# Patient Record
Sex: Male | Born: 1960 | Race: White | Hispanic: No | Marital: Married | Smoking: Never smoker
Health system: Southern US, Community
[De-identification: ages and names within clinical notes are randomized; demographics above are authoritative.]

## PROBLEM LIST (undated history)

## (undated) DIAGNOSIS — I10 Essential (primary) hypertension: Secondary | ICD-10-CM

## (undated) DIAGNOSIS — K219 Gastro-esophageal reflux disease without esophagitis: Secondary | ICD-10-CM

## (undated) DIAGNOSIS — B019 Varicella without complication: Secondary | ICD-10-CM

## (undated) DIAGNOSIS — R001 Bradycardia, unspecified: Secondary | ICD-10-CM

## (undated) DIAGNOSIS — I2510 Atherosclerotic heart disease of native coronary artery without angina pectoris: Secondary | ICD-10-CM

## (undated) DIAGNOSIS — I712 Thoracic aortic aneurysm, without rupture, unspecified: Secondary | ICD-10-CM

## (undated) DIAGNOSIS — E785 Hyperlipidemia, unspecified: Secondary | ICD-10-CM

## (undated) DIAGNOSIS — Z9289 Personal history of other medical treatment: Secondary | ICD-10-CM

## (undated) HISTORY — DX: Personal history of other medical treatment: Z92.89

## (undated) HISTORY — DX: Thoracic aortic aneurysm, without rupture, unspecified: I71.20

## (undated) HISTORY — DX: Gastro-esophageal reflux disease without esophagitis: K21.9

## (undated) HISTORY — DX: Varicella without complication: B01.9

---

## 1990-04-23 HISTORY — PX: APPENDECTOMY: SHX54

## 1997-04-23 HISTORY — PX: HERNIA REPAIR: SHX51

## 2001-09-08 ENCOUNTER — Ambulatory Visit (HOSPITAL_COMMUNITY): Admission: RE | Admit: 2001-09-08 | Discharge: 2001-09-08 | Payer: Self-pay | Admitting: Gastroenterology

## 2005-11-14 ENCOUNTER — Emergency Department (HOSPITAL_COMMUNITY): Admission: EM | Admit: 2005-11-14 | Discharge: 2005-11-14 | Payer: Self-pay | Admitting: Emergency Medicine

## 2010-05-05 ENCOUNTER — Encounter
Admission: RE | Admit: 2010-05-05 | Discharge: 2010-05-05 | Payer: Self-pay | Source: Home / Self Care | Attending: Orthopedic Surgery | Admitting: Orthopedic Surgery

## 2011-03-28 ENCOUNTER — Other Ambulatory Visit: Payer: Self-pay | Admitting: *Deleted

## 2011-03-28 ENCOUNTER — Ambulatory Visit
Admission: RE | Admit: 2011-03-28 | Discharge: 2011-03-28 | Disposition: A | Discharge status: Home / Self Care | Payer: BC Managed Care – PPO | Source: Ambulatory Visit | Attending: *Deleted | Admitting: *Deleted

## 2011-03-28 DIAGNOSIS — M25559 Pain in unspecified hip: Secondary | ICD-10-CM

## 2012-05-24 DIAGNOSIS — I2510 Atherosclerotic heart disease of native coronary artery without angina pectoris: Secondary | ICD-10-CM

## 2012-05-24 HISTORY — DX: Atherosclerotic heart disease of native coronary artery without angina pectoris: I25.10

## 2012-05-24 HISTORY — PX: CORONARY ANGIOPLASTY WITH STENT PLACEMENT: SHX49

## 2012-06-16 ENCOUNTER — Encounter (HOSPITAL_COMMUNITY): Payer: Self-pay | Admitting: *Deleted

## 2012-06-16 ENCOUNTER — Inpatient Hospital Stay (HOSPITAL_COMMUNITY)
Admission: EM | Admit: 2012-06-16 | Discharge: 2012-06-18 | DRG: 853 | Disposition: A | Discharge status: Home / Self Care | Payer: BC Managed Care – PPO | Attending: Cardiovascular Disease | Admitting: Cardiovascular Disease

## 2012-06-16 ENCOUNTER — Emergency Department (HOSPITAL_COMMUNITY): Payer: BC Managed Care – PPO

## 2012-06-16 DIAGNOSIS — I2510 Atherosclerotic heart disease of native coronary artery without angina pectoris: Secondary | ICD-10-CM

## 2012-06-16 DIAGNOSIS — I10 Essential (primary) hypertension: Secondary | ICD-10-CM | POA: Diagnosis present

## 2012-06-16 DIAGNOSIS — R079 Chest pain, unspecified: Secondary | ICD-10-CM

## 2012-06-16 DIAGNOSIS — R778 Other specified abnormalities of plasma proteins: Secondary | ICD-10-CM

## 2012-06-16 DIAGNOSIS — K219 Gastro-esophageal reflux disease without esophagitis: Secondary | ICD-10-CM | POA: Diagnosis present

## 2012-06-16 DIAGNOSIS — R001 Bradycardia, unspecified: Secondary | ICD-10-CM

## 2012-06-16 DIAGNOSIS — E785 Hyperlipidemia, unspecified: Secondary | ICD-10-CM | POA: Diagnosis present

## 2012-06-16 DIAGNOSIS — I249 Acute ischemic heart disease, unspecified: Secondary | ICD-10-CM

## 2012-06-16 DIAGNOSIS — Z7982 Long term (current) use of aspirin: Secondary | ICD-10-CM

## 2012-06-16 DIAGNOSIS — Z79899 Other long term (current) drug therapy: Secondary | ICD-10-CM

## 2012-06-16 DIAGNOSIS — I200 Unstable angina: Secondary | ICD-10-CM

## 2012-06-16 DIAGNOSIS — I214 Non-ST elevation (NSTEMI) myocardial infarction: Principal | ICD-10-CM | POA: Diagnosis present

## 2012-06-16 HISTORY — DX: Bradycardia, unspecified: R00.1

## 2012-06-16 HISTORY — DX: Essential (primary) hypertension: I10

## 2012-06-16 HISTORY — DX: Atherosclerotic heart disease of native coronary artery without angina pectoris: I25.10

## 2012-06-16 HISTORY — DX: Hyperlipidemia, unspecified: E78.5

## 2012-06-16 LAB — PROTIME-INR: Prothrombin Time: 13 seconds (ref 11.6–15.2)

## 2012-06-16 LAB — POCT I-STAT TROPONIN I

## 2012-06-16 LAB — POCT I-STAT, CHEM 8
Chloride: 104 mEq/L (ref 96–112)
Creatinine, Ser: 1.3 mg/dL (ref 0.50–1.35)
Glucose, Bld: 91 mg/dL (ref 70–99)

## 2012-06-16 LAB — CBC
HCT: 38.8 % — ABNORMAL LOW (ref 39.0–52.0)
Hemoglobin: 14 g/dL (ref 13.0–17.0)
MCH: 35.4 pg — ABNORMAL HIGH (ref 26.0–34.0)
MCHC: 36.1 g/dL — ABNORMAL HIGH (ref 30.0–36.0)
Platelets: 247 10*3/uL (ref 150–400)
RBC: 3.96 MIL/uL — ABNORMAL LOW (ref 4.22–5.81)

## 2012-06-16 MED ORDER — NITROGLYCERIN 2 % TD OINT
1.0000 [in_us] | TOPICAL_OINTMENT | Freq: Once | TRANSDERMAL | Status: AC
  Administered 2012-06-16: 1 [in_us] via TOPICAL
  Filled 2012-06-16: qty 1

## 2012-06-16 MED ORDER — ATORVASTATIN CALCIUM 80 MG PO TABS: 80.0000 mg | ORAL_TABLET | Freq: Every day | ORAL | Status: DC

## 2012-06-16 MED ORDER — MECLIZINE HCL 25 MG PO TABS
25.0000 mg | ORAL_TABLET | Freq: Once | ORAL | Status: DC
  Filled 2012-06-16: qty 1

## 2012-06-16 MED ORDER — MORPHINE SULFATE 4 MG/ML IJ SOLN
4.0000 mg | Freq: Once | INTRAMUSCULAR | Status: AC
  Administered 2012-06-16: 4 mg via INTRAVENOUS
  Filled 2012-06-16: qty 1

## 2012-06-16 MED ORDER — HEPARIN BOLUS VIA INFUSION
4000.0000 [IU] | Freq: Once | INTRAVENOUS | Status: AC
  Administered 2012-06-16: 4000 [IU] via INTRAVENOUS

## 2012-06-16 MED ORDER — BUPROPION HCL ER (XL) 150 MG PO TB24
150.0000 mg | ORAL_TABLET | Freq: Every day | ORAL | Status: DC
  Administered 2012-06-17 – 2012-06-18 (×2): 150 mg via ORAL
  Filled 2012-06-16 (×2): qty 1

## 2012-06-16 MED ORDER — ONDANSETRON HCL 4 MG/2ML IJ SOLN: 4.0000 mg | Freq: Four times a day (QID) | INTRAMUSCULAR | Status: DC | PRN

## 2012-06-16 MED ORDER — ASPIRIN EC 81 MG PO TBEC
81.0000 mg | DELAYED_RELEASE_TABLET | Freq: Every day | ORAL | Status: DC
  Administered 2012-06-17 – 2012-06-18 (×2): 81 mg via ORAL
  Filled 2012-06-16 (×2): qty 1

## 2012-06-16 MED ORDER — ACETAMINOPHEN 325 MG PO TABS: 650.0000 mg | ORAL_TABLET | ORAL | Status: DC | PRN

## 2012-06-16 MED ORDER — BUPROPION HCL ER (XL) 150 MG PO TB24: 150.0000 mg | ORAL_TABLET | Freq: Every day | ORAL | Status: DC

## 2012-06-16 MED ORDER — HEPARIN (PORCINE) IN NACL 100-0.45 UNIT/ML-% IJ SOLN
1300.0000 [IU]/h | INTRAMUSCULAR | Status: DC
  Administered 2012-06-16: 1300 [IU]/h via INTRAVENOUS
  Filled 2012-06-16 (×2): qty 250

## 2012-06-16 MED ORDER — ASPIRIN EC 325 MG PO TBEC: 325.0000 mg | DELAYED_RELEASE_TABLET | Freq: Every day | ORAL | Status: DC

## 2012-06-16 MED ORDER — SODIUM CHLORIDE 0.9 % IV SOLN
1.0000 mL/kg/h | INTRAVENOUS | Status: DC
  Administered 2012-06-17: 1 mL/kg/h via INTRAVENOUS

## 2012-06-16 MED ORDER — NITROGLYCERIN 2 % TD OINT
1.0000 [in_us] | TOPICAL_OINTMENT | Freq: Four times a day (QID) | TRANSDERMAL | Status: DC
Start: 2012-06-16 — End: 2012-06-17
  Administered 2012-06-16 – 2012-06-17 (×2): 1 [in_us] via TOPICAL
  Filled 2012-06-16: qty 30

## 2012-06-16 MED ORDER — SODIUM CHLORIDE 0.9 % IJ SOLN: 3.0000 mL | INTRAMUSCULAR | Status: DC | PRN

## 2012-06-16 MED ORDER — METOPROLOL TARTRATE 25 MG PO TABS
25.0000 mg | ORAL_TABLET | Freq: Two times a day (BID) | ORAL | Status: DC
  Filled 2012-06-16: qty 1

## 2012-06-16 MED ORDER — CAPTOPRIL 6.25 MG HALF TABLET
6.2500 mg | ORAL_TABLET | Freq: Three times a day (TID) | ORAL | Status: DC
  Administered 2012-06-17 (×3): 6.25 mg via ORAL
  Filled 2012-06-16 (×8): qty 1

## 2012-06-16 MED ORDER — SODIUM CHLORIDE 0.9 % IV SOLN: 250.0000 mL | INTRAVENOUS | Status: DC | PRN

## 2012-06-16 MED ORDER — ATORVASTATIN CALCIUM 40 MG PO TABS
40.0000 mg | ORAL_TABLET | Freq: Every day | ORAL | Status: AC
  Administered 2012-06-16: 40 mg via ORAL
  Filled 2012-06-16: qty 1

## 2012-06-16 MED ORDER — METOPROLOL TARTRATE 12.5 MG HALF TABLET: 12.5000 mg | ORAL_TABLET | Freq: Two times a day (BID) | ORAL | Status: DC

## 2012-06-16 MED ORDER — ASPIRIN 81 MG PO CHEW
324.0000 mg | CHEWABLE_TABLET | Freq: Once | ORAL | Status: AC
  Administered 2012-06-16: 324 mg via ORAL
  Filled 2012-06-16: qty 4

## 2012-06-16 MED ORDER — DIAZEPAM 2 MG PO TABS
2.0000 mg | ORAL_TABLET | ORAL | Status: AC
  Administered 2012-06-17: 2 mg via ORAL
  Filled 2012-06-16: qty 1

## 2012-06-16 MED ORDER — ATORVASTATIN CALCIUM 80 MG PO TABS
80.0000 mg | ORAL_TABLET | Freq: Every day | ORAL | Status: DC
Start: 2012-06-16 — End: 2012-06-16
  Filled 2012-06-16: qty 1

## 2012-06-16 MED ORDER — SODIUM CHLORIDE 0.9 % IJ SOLN: 3.0000 mL | Freq: Two times a day (BID) | INTRAMUSCULAR | Status: DC

## 2012-06-16 MED ORDER — NITROGLYCERIN 0.4 MG SL SUBL: 0.4000 mg | SUBLINGUAL_TABLET | SUBLINGUAL | Status: DC | PRN

## 2012-06-16 MED ORDER — ONDANSETRON HCL 4 MG/2ML IJ SOLN
4.0000 mg | Freq: Once | INTRAMUSCULAR | Status: AC
  Administered 2012-06-16: 4 mg via INTRAVENOUS
  Filled 2012-06-16: qty 2

## 2012-06-16 MED ORDER — ASPIRIN EC 81 MG PO TBEC: 81.0000 mg | DELAYED_RELEASE_TABLET | Freq: Every day | ORAL | Status: DC

## 2012-06-16 NOTE — ED Notes (Addendum)
Pt with chest pain since this am.  He originally thought it was heart burn, but pain increased.  Was seen by pcp and sent here for abnormal ekg.  Only med to relieve pain was gavascon.

## 2012-06-16 NOTE — H&P (Addendum)
Patient ID: Joshua King MRN: [id number], DOB/AGE: 54-26-1962   Admit date: 06/16/2012 Date of Consult: @TODAY @  Primary Physician: Default, Provider, MD Primary Cardiologist: New    Problem List: Past Medical History  Diagnosis Date  . Heartburn     Past Surgical History  Procedure Laterality Date  . Appendectomy  1992  . Hernia repair  1999    inguinal     Allergies: No Known Allergies  HPI: Patient is a 52 yo who presents to the [facility] ER for evaluation of CP The patient has no prior cardiac history. He has had 4 episodes that he considered GE reflux in past, last 15 yrs ago. Last week on Mon through Wed he had intermitt episodes of chestr burning.  No associated SOB  Seemed to be more when active.  Relieved with rest  Thought might be reflux as similar to what had in past. Spoke to [facility] Medoff.  Rx for Omeprazole made.  Patient also began taking Gaviscon. Thurs, Fri no complaints Saturday went walking at [employer] school track.  Developed chest burning.  Took ASA  Rest  Went away. Sunday did work out (wts) then walked  Some burning  Eased with rest.  Swam 4 lengths pool  Developed substernal burning  Stopped.   Last night slept OK Today woke up.  Developed intermitt chest burning.Mild.  Waves.  Considered not going on business trip. Got to airport.  Pain increased to 8/10.  Cancelled trip Went to [facility] Medoff office  EKG done  Told to come to ER.  Currently pain free.  No SOB  No    Inpatient Medications:  Wellbutrin  Family History:  Father 68  CAD MI at 25.  Dementia Mother HTN 2 brothers 1 sister - A and W   History   Social History  . Marital Status: Married         Number of Children: 2  .     Occupational History  . Not on file.   Social History Main Topics  . Smoking status: No  .    . Alcohol Use: 6-8 glasses wine per week  . Drug Use: No  .      Concern              Review of Systems: Patient noted some cough and runny nose today. No F/C.    Otherwise all other systems reviewed and are otherwise negative except as noted above.  Physical Exam: Filed Vitals:   06/16/12 2030  BP: 138/110  Pulse: 70  Temp:   Resp: 19    BP currently 135/77  General: Well developed, well nourished, in no acute distress. Head: Normocephalic, atraumatic, sclera non-icteric Neck: Negative for carotid bruits. JVP not elevated. Lungs: Clear bilaterally to auscultation without wheezes, rales, or rhonchi. Breathing is unlabored. Heart: RRR with S1 S2. No murmurs, rubs, or gallops appreciated. Abdomen: Soft, non-tender, non-distended with normoactive bowel sounds. No hepatomegaly. No rebound/guarding. No obvious abdominal masses. Msk:  Strength and tone appears normal for age. Extremities: No clubbing, cyanosis or edema.  Distal pedal pulses are 2+ and equal bilaterally. Neuro: Alert and oriented X 3. Moves all extremities spontaneously. Psych:  Responds to questions appropriately with a normal affect.  Labs: Results for orders placed during the hospital encounter of 06/16/12 (from the past 24 hour(s))  CBC     Status: Abnormal   Collection Time    06/16/12  7:14 PM      Result Value Range  WBC 6.3  4.0 - 10.5 K/uL   RBC 3.96 (*) 4.22 - 5.81 MIL/uL   Hemoglobin 14.0  13.0 - 17.0 g/dL   HCT 11.9 (*) 14.7 - 82.9 %   MCV 98.0  78.0 - 100.0 fL   MCH 35.4 (*) 26.0 - 34.0 pg   MCHC 36.1 (*) 30.0 - 36.0 g/dL   RDW 56.2  13.0 - 86.5 %   Platelets 247  150 - 400 K/uL  POCT I-STAT TROPONIN I     Status: Abnormal   Collection Time    06/16/12  7:38 PM      Result Value Range   Troponin i, poc 0.28 (*) 0.00 - 0.08 ng/mL   Comment NOTIFIED PHYSICIAN     Comment 3           POCT I-STAT, CHEM 8     Status: None   Collection Time    06/16/12  7:40 PM      Result Value Range   Sodium 141  135 - 145 mEq/L   Potassium 4.0  3.5 - 5.1 mEq/L   Chloride 104  96 - 112 mEq/L   BUN 11  6 - 23 mg/dL   Creatinine, Ser 7.84  0.50 - 1.35 mg/dL   Glucose,  Bld 91  70 - 99 mg/dL   Calcium, Ion 6.96  2.95 - 1.23 mmol/L   TCO2 29  0 - 100 mmol/L   Hemoglobin 14.6  13.0 - 17.0 g/dL   HCT 28.4  13.2 - 44.0 %  PROTIME-INR     Status: None   Collection Time    06/16/12  8:11 PM      Result Value Range   Prothrombin Time 13.0  11.6 - 15.2 seconds   INR 0.99  0.00 - 1.49  APTT     Status: None   Collection Time    06/16/12  8:11 PM      Result Value Range   aPTT 30  24 - 37 seconds    Radiology/Studies: Dg Chest Port 1 View  06/16/2012  *RADIOLOGY REPORT*  Clinical Data: Chest pain  PORTABLE CHEST - 1 VIEW  Comparison: 11/14/2005 chest radiograph  Findings: The cardiomediastinal silhouette is unremarkable. The lungs are clear. There is no evidence of focal airspace disease, pulmonary edema, suspicious pulmonary nodule/mass, pleural effusion, or pneumothorax. No acute bony abnormalities are identified.  IMPRESSION: No evidence of active cardiopulmonary disease.   Original Report Authenticated By: Harmon Pier, M.D.     EKG:  SR 73  T wave inversion I, AVL.  ASSESSMENT AND PLAN:  Patient is a 52 yo who presents with a 1 wk history of substernal chest burning.  Intermittent  Mostly exertional.  Today woke up and developed pain today that worsened.  He had attrib to reflux and today went to his gastroenterologist.  EKG with changes  Sent to ER Currently pain free.  EKG with T wave inversion in I and AVL  Labs signif for trop I of 0.28.  Plan:  Admit to telemetry  Begin IV heparin.  ASA. Continue NTG paste.  WIll not start lopressor as resting HR in 50s.  Empiric lipitor. Cycle cardiac enzymes.  Plan L heart cath for am  Discussed with patient  Will give Protonix empirically.  Will check lipid panel in AM  Patient says HDL was high but LDL was a little high as well (december 2013)    Signed, Dietrich Pates 06/16/2012, 9:16 PM

## 2012-06-16 NOTE — ED Notes (Signed)
Notified triage nurse Christa and Dr. Denton Lank, patient moved to room 3

## 2012-06-16 NOTE — ED Notes (Signed)
Dr. Kinnie Scales called to report pt. Coming with chest pain, EkG with pt.

## 2012-06-16 NOTE — ED Notes (Signed)
EDP at bedside for assessment 

## 2012-06-16 NOTE — ED Provider Notes (Signed)
History     CSN: [id number]  Arrival date & time 06/16/12  1843   First MD Initiated Contact with Patient 06/16/12 2001      Chief Complaint  Patient presents with  . Chest Pain    (Consider location/radiation/quality/duration/timing/severity/associated sxs/prior treatment) Patient is a 52 y.o. male presenting with chest pain. The history is provided by the patient.  Chest Pain Associated symptoms: no abdominal pain, no back pain, no cough, no fever, no headache, no palpitations and no shortness of breath   pt with hx borderline hypercholesterolemia, borderline htn, c/o intermittent mid chest pain for past week. States ?hx gerd which he initially was attributing his symptoms to, but states episodes cp worse through week and especially today. Worse w exertion, ?better w rest. Dull, burning discomfort, non radiating. No associated nv, diaphoresis or sob. Pain is not pleuritic. No leg pain or swelling. Denies hx cad or family hx premature cad, ?fam hx cad in older age. Non smoker. No hx dvt or pe, does travel, no surgery or trauma. Denies chest wall injury or strain. Reports neg stress test several yrs ago.     Past Medical History  Diagnosis Date  . Heartburn     Past Surgical History  Procedure Laterality Date  . Appendectomy  1992  . Hernia repair  1999    inguinal    No family history on file.  History  Substance Use Topics  . Smoking status: Not on file  . Smokeless tobacco: Not on file  . Alcohol Use: Not on file      Review of Systems  Constitutional: Negative for fever.  HENT: Negative for neck pain.   Eyes: Negative for visual disturbance.  Respiratory: Negative for cough and shortness of breath.   Cardiovascular: Positive for chest pain. Negative for palpitations and leg swelling.  Gastrointestinal: Negative for abdominal pain.  Genitourinary: Negative for flank pain.  Musculoskeletal: Negative for back pain.  Skin: Negative for rash.  Neurological:  Negative for headaches.  Hematological: Does not bruise/bleed easily.  Psychiatric/Behavioral: Negative for confusion.    Allergies  Review of patient's allergies indicates no known allergies.  Home Medications   Current Outpatient Rx  Name  Route  Sig  Dispense  Refill  . aspirin EC 81 MG tablet   Oral   Take 162 mg by mouth once.         Marland Kitchen buPROPion (WELLBUTRIN XL) 150 MG 24 hr tablet   Oral   Take 150 mg by mouth daily.         . diazepam (VALIUM) 2 MG tablet   Oral   Take 0.5 mg by mouth once.         . meclizine (ANTIVERT) 25 MG tablet   Oral   Take 25 mg by mouth once.           BP 148/85  Pulse 70  Temp(Src) 99 F (37.2 C) (Oral)  Resp 18  Ht 5\' 10"  (1.778 m)  Wt 230 lb (104.327 kg)  BMI 33 kg/m2  SpO2 100%  Physical Exam  Nursing note and vitals reviewed. Constitutional: He is oriented to person, place, and time. He appears well-developed and well-nourished. No distress.  HENT:  Head: Atraumatic.  Eyes: Conjunctivae are normal. No scleral icterus.  Neck: Neck supple. No tracheal deviation present.  Cardiovascular: Normal rate, regular rhythm, normal heart sounds and intact distal pulses.  Exam reveals no gallop and no friction rub.   No murmur heard. Pulmonary/Chest: Effort  normal. No accessory muscle usage. No respiratory distress. He exhibits no tenderness.  Abdominal: Soft. He exhibits no distension. There is no tenderness.  Musculoskeletal: Normal range of motion. He exhibits no edema and no tenderness.  Neurological: He is alert and oriented to person, place, and time.  Skin: Skin is warm and dry.  Psychiatric: He has a normal mood and affect.    ED Course  Procedures (including critical care time)  Results for orders placed during the hospital encounter of 06/16/12  CBC      Result Value Range   WBC 6.3  4.0 - 10.5 K/uL   RBC 3.96 (*) 4.22 - 5.81 MIL/uL   Hemoglobin 14.0  13.0 - 17.0 g/dL   HCT 16.1 (*) 09.6 - 04.5 %   MCV 98.0   78.0 - 100.0 fL   MCH 35.4 (*) 26.0 - 34.0 pg   MCHC 36.1 (*) 30.0 - 36.0 g/dL   RDW 40.9  81.1 - 91.4 %   Platelets 247  150 - 400 K/uL  POCT I-STAT, CHEM 8      Result Value Range   Sodium 141  135 - 145 mEq/L   Potassium 4.0  3.5 - 5.1 mEq/L   Chloride 104  96 - 112 mEq/L   BUN 11  6 - 23 mg/dL   Creatinine, Ser 7.82  0.50 - 1.35 mg/dL   Glucose, Bld 91  70 - 99 mg/dL   Calcium, Ion 9.56  2.13 - 1.23 mmol/L   TCO2 29  0 - 100 mmol/L   Hemoglobin 14.6  13.0 - 17.0 g/dL   HCT 08.6  57.8 - 46.9 %  POCT I-STAT TROPONIN I      Result Value Range   Troponin i, poc 0.28 (*) 0.00 - 0.08 ng/mL   Comment NOTIFIED PHYSICIAN     Comment 3            Dg Chest Port 1 View  06/16/2012  *RADIOLOGY REPORT*  Clinical Data: Chest pain  PORTABLE CHEST - 1 VIEW  Comparison: 11/14/2005 chest radiograph  Findings: The cardiomediastinal silhouette is unremarkable. The lungs are clear. There is no evidence of focal airspace disease, pulmonary edema, suspicious pulmonary nodule/mass, pleural effusion, or pneumothorax. No acute bony abnormalities are identified.  IMPRESSION: No evidence of active cardiopulmonary disease.   Original Report Authenticated By: Harmon Pier, M.D.       1. Unstable angina   2. Elevated troponin       MDM  Iv ns. O2. Monitor. Ecg. Cxr. Labs.  Pt w baby asa earlier today, chewable asa given in ed.  Sl chest discomfort still present, nt paste, morphine iv.   Heparin bolus/gtt per pharmacy.   From initial labs, troponin elevated, cp initially persisting on ed arrival.  Cardiology called-  Dr Tenny Craw and fellow will see in ed/admit.  Reviewed nursing notes and prior charts for additional history.    Date: 06/16/2012  Rate: 73  Rhythm: normal sinus rhythm  QRS Axis: normal  Intervals: normal  ST/T Wave abnormalities: nonspecific T wave changes  Conduction Disutrbances:none  Narrative Interpretation:   Old EKG Reviewed: none available  Recheck no chest pain.  Pt  awaiting admission.  CRITICAL CARE Performed by: Suzi Roots   Total critical care time: 35  Critical care time was exclusive of separately billable procedures and treating other patients.  Critical care was necessary to treat or prevent imminent or life-threatening deterioration.  Critical care was time spent personally by me  on the following activities: development of treatment plan with patient and/or surrogate as well as nursing, discussions with consultants, evaluation of patient's response to treatment, examination of patient, obtaining history from patient or surrogate, ordering and performing treatments and interventions, ordering and review of laboratory studies, ordering and review of radiographic studies, pulse oximetry and re-evaluation of patient's condition.       Suzi Roots, MD 06/16/12 2035

## 2012-06-16 NOTE — Progress Notes (Signed)
ANTICOAGULATION CONSULT NOTE - Initial Consult  Pharmacy Consult for UFH Indication: NSTEMI  No Known Allergies  Patient Measurements: Height: 5\' 10"  (177.8 cm) Weight: 230 lb (104.327 kg) IBW/kg (Calculated) : 73 Heparin Dosing Weight: 95kg  Vital Signs: Temp: 99 F (37.2 C) (02/24 1858) Temp src: Oral (02/24 1858) BP: 148/85 mmHg (02/24 1858) Pulse Rate: 70 (02/24 1858)  Labs:  Recent Labs  06/16/12 1914 06/16/12 1940  HGB 14.0 14.6  HCT 38.8* 43.0  PLT 247  --   CREATININE  --  1.30    Estimated Creatinine Clearance: 79.5 ml/min (by C-G formula based on Cr of 1.3).   Medical History: Past Medical History  Diagnosis Date  . Heartburn     Medications:   (Not in a hospital admission)  Assessment: 52 y/o male patient admitted with chest pain, abnormal EKG and positive cardiac enzymes requiring anticoagulation for NSTEMI.  Goal of Therapy:  Heparin level 0.3-0.7 units/ml Monitor platelets by anticoagulation protocol: Yes   Plan:  Heparin 4000 unit IV bolus followed by infusion at 1300 units/hr . Check 6 hour heparin level with daily cbc and heparin level.  Verlene Mayer, PharmD, BCPS Pager [phone] 06/16/2012,8:28 PM

## 2012-06-16 NOTE — H&P (Signed)
Joshua King is an 52 y.o. male.   Chief Complaint: Chest Pain HPI: 52 yo man with PMH of GERD, hypertension and dyslipidemia who has had intermittent chest pain associated with exertion for several days. He thought his pain was related to his underlying GERD but symptoms have continued to persist, worse with exertion and not improving leading to presentation. He characterizes the pain as dull with some burning, no radiation, no associated nausea/vomiting or shortness of breath. The pain has happened similar to this 4-5x over the last 15 years and started again most recently approximately 1 week ago last Monday. Symptoms now have occurred while walking on the track over the weekend (1/2 mile into a 3-4 mile walk) and again today after a walk. He frequently walks 3-4 miles, lifts weights and stays very active - no DOE/SOB.  Multiple recent travels to [place] and back. [place] contacts. He has had a negative stress test in the past. He took aspirin at home and now up to 324 mg dose + heparin gtt in ER and currently 1300 units/hr.    Past Medical History  Diagnosis Date  . Heartburn     Past Surgical History  Procedure Laterality Date  . Appendectomy  1992  . Hernia repair  1999    inguinal   Family history: mother living in 57s overweight; father with CAD/atrial fibrillation starting in 17s, living well in his 85s now Family History  Problem Relation Age of Onset  . CAD Father 44  . Heart attack Father    Social History:  reports that he has never smoked. He has never used smokeless tobacco. He reports that he drinks about 3.0 ounces of alcohol per week. His drug history is not on file.  Allergies: No Known Allergies  Medications Prior to Admission  Medication Sig Dispense Refill  . aspirin EC 81 MG tablet Take 162 mg by mouth once.      Marland Kitchen buPROPion (WELLBUTRIN XL) 150 MG 24 hr tablet Take 150 mg by mouth daily.      . diazepam (VALIUM) 2 MG tablet Take 0.5 mg by mouth once.      .  meclizine (ANTIVERT) 25 MG tablet Take 25 mg by mouth once.        Results for orders placed during the hospital encounter of 06/16/12 (from the past 48 hour(s))  CBC     Status: Abnormal   Collection Time    06/16/12  7:14 PM      Result Value Range   WBC 6.3  4.0 - 10.5 K/uL   RBC 3.96 (*) 4.22 - 5.81 MIL/uL   Hemoglobin 14.0  13.0 - 17.0 g/dL   HCT 45.4 (*) 09.8 - 11.9 %   MCV 98.0  78.0 - 100.0 fL   MCH 35.4 (*) 26.0 - 34.0 pg   MCHC 36.1 (*) 30.0 - 36.0 g/dL   RDW 14.7  82.9 - 56.2 %   Platelets 247  150 - 400 K/uL  POCT I-STAT TROPONIN I     Status: Abnormal   Collection Time    06/16/12  7:38 PM      Result Value Range   Troponin i, poc 0.28 (*) 0.00 - 0.08 ng/mL   Comment NOTIFIED PHYSICIAN     Comment 3            Comment: Due to the release kinetics of cTnI,     a negative result within the first hours     of the  onset of symptoms does not rule out     myocardial infarction with certainty.     If myocardial infarction is still suspected,     repeat the test at appropriate intervals.  POCT I-STAT, CHEM 8     Status: None   Collection Time    06/16/12  7:40 PM      Result Value Range   Sodium 141  135 - 145 mEq/L   Potassium 4.0  3.5 - 5.1 mEq/L   Chloride 104  96 - 112 mEq/L   BUN 11  6 - 23 mg/dL   Creatinine, Ser 4.09  0.50 - 1.35 mg/dL   Glucose, Bld 91  70 - 99 mg/dL   Calcium, Ion 8.11  9.14 - 1.23 mmol/L   TCO2 29  0 - 100 mmol/L   Hemoglobin 14.6  13.0 - 17.0 g/dL   HCT 78.2  95.6 - 21.3 %  PROTIME-INR     Status: None   Collection Time    06/16/12  8:11 PM      Result Value Range   Prothrombin Time 13.0  11.6 - 15.2 seconds   INR 0.99  0.00 - 1.49  APTT     Status: None   Collection Time    06/16/12  8:11 PM      Result Value Range   aPTT 30  24 - 37 seconds  TROPONIN I     Status: Abnormal   Collection Time    06/16/12  9:03 PM      Result Value Range   Troponin I 0.72 (*) <0.30 ng/mL   Comment:            Due to the release kinetics of  cTnI,     a negative result within the first hours     of the onset of symptoms does not rule out     myocardial infarction with certainty.     If myocardial infarction is still suspected,     repeat the test at appropriate intervals.     CRITICAL RESULT CALLED TO, READ BACK BY AND VERIFIED WITH:     E GENTILE RN 2.24.14 AT 2208 BY ROMEROJ   Dg Chest [facility] 1 View  06/16/2012  *RADIOLOGY REPORT*  Clinical Data: Chest pain  PORTABLE CHEST - 1 VIEW  Comparison: 11/14/2005 chest radiograph  Findings: The cardiomediastinal silhouette is unremarkable. The lungs are clear. There is no evidence of focal airspace disease, pulmonary edema, suspicious pulmonary nodule/mass, pleural effusion, or pneumothorax. No acute bony abnormalities are identified.  IMPRESSION: No evidence of active cardiopulmonary disease.   Original Report Authenticated By: Harmon Pier, M.D.     Review of Systems  Constitutional: Negative for fever, chills and weight loss.  HENT: Negative for hearing loss, neck pain and tinnitus.   Eyes: Negative for blurred vision, double vision and photophobia.  Respiratory: Negative for cough, hemoptysis and sputum production.   Cardiovascular: Positive for chest pain and palpitations. Negative for orthopnea, claudication and leg swelling.  Gastrointestinal: Negative for heartburn, nausea, vomiting and abdominal pain.  Genitourinary: Negative for dysuria, urgency and frequency.  Musculoskeletal: Negative for myalgias and back pain.  Skin: Negative for itching and rash.  Neurological: Negative for dizziness, tingling, sensory change and headaches.  Endo/Heme/Allergies: Negative for environmental allergies. Does not bruise/bleed easily.  Psychiatric/Behavioral: Negative for depression, suicidal ideas and substance abuse.    Blood pressure 116/71, pulse 51, temperature 99 F (37.2 C), temperature source Oral, resp. rate 18, height 5'  10" (1.778 m), weight 104.327 kg (230 lb), SpO2  100.00%. Physical Exam  Nursing note and vitals reviewed. Constitutional: He is oriented to person, place, and time. He appears well-developed and well-nourished. No distress.  HENT:  Head: Normocephalic and atraumatic.  Nose: Nose normal.  Mouth/Throat: Oropharynx is clear and moist. No oropharyngeal exudate.  Eyes: Conjunctivae and EOM are normal. Pupils are equal, round, and reactive to light. No scleral icterus.  Neck: Normal range of motion. Neck supple. No JVD present. No tracheal deviation present. No thyromegaly present.  Cardiovascular: Regular rhythm, normal heart sounds and intact distal pulses.  Exam reveals no gallop and no friction rub.   No murmur heard. Sinus bradycardia  Respiratory: Effort normal and breath sounds normal. No respiratory distress. He has no wheezes. He has no rales.  GI: Soft. Bowel sounds are normal. He exhibits no distension. There is no tenderness. There is no rebound.  Musculoskeletal: Normal range of motion. He exhibits no edema and no tenderness.  Neurological: He is alert and oriented to person, place, and time. He has normal reflexes. No cranial nerve deficit. Coordination normal.  Skin: Skin is warm and dry. No rash noted. He is not diaphoretic. No erythema.  Psychiatric: He has a normal mood and affect. His behavior is normal.     Labs reviewed; Troponin 0.28, wbc 6.3, h/h 14/39, plt 247s, na 141, K 4.0, cr 1.3, bun 11 Problem List Exertional Chest Pain + Troponin/ACS Hypertension GERD Dyslipidemia Cr 1.3  Assessment/Plan 52 yo man with history of GERD, hypertension, dyslipidemia now with exertional chest pain and elevated troponin consistent with ACS.  - continue heparin gtt per pharmacy - received bolus and currently 1300 units/hr - received 324 mg aspirin - given chest pain free, will defer P2Y2 until time of left heart catheterization in AM unless symptoms change overnight; then will consider GP2B3A - TSH, hba1c, lipid panel - Echo  in AM if no LV gram - defer to AM team   Dacen Frayre 06/16/2012, 11:07 PM

## 2012-06-16 NOTE — ED Notes (Signed)
Critical troponin 0.72 from lab, EDP aware.

## 2012-06-16 NOTE — ED Notes (Signed)
Admitting MD at bedside.

## 2012-06-17 ENCOUNTER — Encounter (HOSPITAL_COMMUNITY)
Admission: EM | Disposition: A | Discharge status: Home / Self Care | Payer: Self-pay | Source: Home / Self Care | Attending: Cardiovascular Disease

## 2012-06-17 ENCOUNTER — Other Ambulatory Visit: Payer: Self-pay

## 2012-06-17 ENCOUNTER — Encounter (HOSPITAL_COMMUNITY): Payer: Self-pay | Admitting: *Deleted

## 2012-06-17 DIAGNOSIS — I2510 Atherosclerotic heart disease of native coronary artery without angina pectoris: Secondary | ICD-10-CM

## 2012-06-17 HISTORY — PX: LEFT HEART CATHETERIZATION WITH CORONARY ANGIOGRAM: SHX5451

## 2012-06-17 HISTORY — PX: PERCUTANEOUS CORONARY STENT INTERVENTION (PCI-S): SHX5485

## 2012-06-17 LAB — CBC
Hemoglobin: 12.8 g/dL — ABNORMAL LOW (ref 13.0–17.0)
MCH: 34.5 pg — ABNORMAL HIGH (ref 26.0–34.0)
Platelets: 222 10*3/uL (ref 150–400)
RBC: 3.71 MIL/uL — ABNORMAL LOW (ref 4.22–5.81)
WBC: 6 10*3/uL (ref 4.0–10.5)

## 2012-06-17 LAB — HEPARIN LEVEL (UNFRACTIONATED): Heparin Unfractionated: 0.34 IU/mL (ref 0.30–0.70)

## 2012-06-17 LAB — LIPID PANEL
Cholesterol: 219 mg/dL — ABNORMAL HIGH (ref 0–200)
Total CHOL/HDL Ratio: 3.4 RATIO
Triglycerides: 72 mg/dL (ref <150)
VLDL: 14 mg/dL (ref 0–40)

## 2012-06-17 LAB — COMPREHENSIVE METABOLIC PANEL
ALT: 22 U/L (ref 0–53)
AST: 23 U/L (ref 0–37)
Albumin: 3.6 g/dL (ref 3.5–5.2)
Alkaline Phosphatase: 40 U/L (ref 39–117)
Calcium: 9 mg/dL (ref 8.4–10.5)
Glucose, Bld: 96 mg/dL (ref 70–99)
Potassium: 4.2 mEq/L (ref 3.5–5.1)
Sodium: 139 mEq/L (ref 135–145)
Total Protein: 6.5 g/dL (ref 6.0–8.3)

## 2012-06-17 LAB — TROPONIN I
Troponin I: 1.15 ng/mL (ref <0.30)
Troponin I: 0.77 ng/mL (ref <0.30)

## 2012-06-17 LAB — HEMOGLOBIN A1C: Mean Plasma Glucose: 111 mg/dL (ref <117)

## 2012-06-17 SURGERY — LEFT HEART CATHETERIZATION WITH CORONARY ANGIOGRAM
Anesthesia: LOCAL

## 2012-06-17 MED ORDER — HEPARIN (PORCINE) IN NACL 2-0.9 UNIT/ML-% IJ SOLN
INTRAMUSCULAR | Status: AC
  Filled 2012-06-17: qty 500

## 2012-06-17 MED ORDER — VERAPAMIL HCL 2.5 MG/ML IV SOLN
INTRAVENOUS | Status: AC
  Filled 2012-06-17: qty 2

## 2012-06-17 MED ORDER — SODIUM CHLORIDE 0.9 % IV SOLN
0.2500 mg/kg/h | INTRAVENOUS | Status: AC
  Administered 2012-06-17: 0.25 mg/kg/h via INTRAVENOUS
  Filled 2012-06-17: qty 250

## 2012-06-17 MED ORDER — ALUM & MAG HYDROXIDE-SIMETH 200-200-20 MG/5ML PO SUSP
30.0000 mL | ORAL | Status: DC | PRN
  Administered 2012-06-17: 30 mL via ORAL

## 2012-06-17 MED ORDER — HEPARIN SODIUM (PORCINE) 1000 UNIT/ML IJ SOLN
INTRAMUSCULAR | Status: AC
  Filled 2012-06-17: qty 1

## 2012-06-17 MED ORDER — FAMOTIDINE IN NACL 20-0.9 MG/50ML-% IV SOLN
INTRAVENOUS | Status: AC
  Filled 2012-06-17: qty 50

## 2012-06-17 MED ORDER — MIDAZOLAM HCL 2 MG/2ML IJ SOLN
INTRAMUSCULAR | Status: AC
  Filled 2012-06-17: qty 2

## 2012-06-17 MED ORDER — PRASUGREL HCL 10 MG PO TABS
10.0000 mg | ORAL_TABLET | Freq: Every day | ORAL | Status: DC
  Filled 2012-06-17: qty 1

## 2012-06-17 MED ORDER — FENTANYL CITRATE 0.05 MG/ML IJ SOLN
INTRAMUSCULAR | Status: AC
  Filled 2012-06-17: qty 2

## 2012-06-17 MED ORDER — LIDOCAINE HCL (PF) 1 % IJ SOLN
INTRAMUSCULAR | Status: AC
  Filled 2012-06-17: qty 30

## 2012-06-17 MED ORDER — BIVALIRUDIN 250 MG IV SOLR
INTRAVENOUS | Status: AC
  Filled 2012-06-17: qty 250

## 2012-06-17 MED ORDER — ALUM & MAG HYDROXIDE-SIMETH 200-200-20 MG/5ML PO SUSP
ORAL | Status: AC
  Filled 2012-06-17: qty 30

## 2012-06-17 MED ORDER — ZOLPIDEM TARTRATE 5 MG PO TABS: 5.0000 mg | ORAL_TABLET | Freq: Every evening | ORAL | Status: DC | PRN

## 2012-06-17 MED ORDER — OXYCODONE-ACETAMINOPHEN 5-325 MG PO TABS
1.0000 | ORAL_TABLET | ORAL | Status: DC | PRN
  Administered 2012-06-17: 21:00:00 2 via ORAL
  Administered 2012-06-18: 1 via ORAL
  Filled 2012-06-17: qty 1
  Filled 2012-06-17: qty 2

## 2012-06-17 MED ORDER — MORPHINE SULFATE 2 MG/ML IJ SOLN
2.0000 mg | INTRAMUSCULAR | Status: DC | PRN
  Administered 2012-06-17: 2 mg via INTRAVENOUS

## 2012-06-17 MED ORDER — NITROGLYCERIN 1 MG/10 ML FOR IR/CATH LAB
INTRA_ARTERIAL | Status: AC
  Filled 2012-06-17: qty 10

## 2012-06-17 MED ORDER — PRASUGREL HCL 10 MG PO TABS
ORAL_TABLET | ORAL | Status: AC
  Administered 2012-06-18: 10 mg via ORAL
  Filled 2012-06-17: qty 6

## 2012-06-17 MED ORDER — MORPHINE SULFATE 2 MG/ML IJ SOLN
INTRAMUSCULAR | Status: AC
  Filled 2012-06-17: qty 1

## 2012-06-17 MED ORDER — SODIUM CHLORIDE 0.9 % IV SOLN: INTRAVENOUS | Status: AC

## 2012-06-17 NOTE — Progress Notes (Signed)
UR Completed Silas Muff Graves-Bigelow, RN,BSN [phone]  

## 2012-06-17 NOTE — Progress Notes (Signed)
    SUBJECTIVE: no chest pain this am.   BP 110/67  Pulse 51  Temp(Src) 97.8 F (36.6 C) (Oral)  Resp 18  Ht 5' 9.5" (1.765 m)  Wt 231 lb 7.7 oz (105 kg)  BMI 33.71 kg/m2  SpO2 100% No intake or output data in the 24 hours ending 06/17/12 1200  PHYSICAL EXAM General: Well developed, well nourished, in no acute distress. Alert and oriented x 3.  Psych:  Good affect, responds appropriately Neck: No JVD. No masses noted.  Lungs: Clear bilaterally with no wheezes or rhonci noted.  Heart: RRR with no murmurs noted. Abdomen: Bowel sounds are present. Soft, non-tender.  Extremities: No lower extremity edema.   LABS: Basic Metabolic Panel:  Recent Labs  16/10/96 1940 06/17/12 0520  NA 141 139  K 4.0 4.2  CL 104 104  CO2  --  28  GLUCOSE 91 96  BUN 11 11  CREATININE 1.30 1.29  CALCIUM  --  9.0  MG  --  2.4   CBC:  Recent Labs  06/16/12 1914 06/16/12 1940 06/17/12 0520  WBC 6.3  --  6.0  HGB 14.0 14.6 12.8*  HCT 38.8* 43.0 37.0*  MCV 98.0  --  99.7  PLT 247  --  222   Cardiac Enzymes:  Recent Labs  06/16/12 2103 06/16/12 2306 06/17/12 0520  TROPONINI 0.72* 1.15* 0.77*   Fasting Lipid Panel:  Recent Labs  06/17/12 0520  CHOL 219*  HDL 65  LDLCALC 140*  TRIG 72  CHOLHDL 3.4    Current Meds: . [facility] HOLD] aspirin EC  81 mg Oral Daily  . [facility] HOLD] buPROPion  150 mg Oral Daily  . [facility] HOLD] captopril  6.25 mg Oral TID  . [facility] HOLD] meclizine  25 mg Oral Once  . [facility] HOLD] nitroGLYCERIN  1 inch Topical Q6H  . sodium chloride  3 mL Intravenous Q12H     ASSESSMENT AND PLAN:  1. NSTEMI: Pt admitted with chest pain worrisome for unstable angina. Has ruled in for MI with elevated troponin. Plans for cardiac cath this am with possible PCI.    Regla Fitzgibbon  2/25/201412:00 PM

## 2012-06-17 NOTE — CV Procedure (Signed)
Cardiac Catheterization Operative Report  Joshua King [id number] 2/25/20141:47 PM Default, Provider, MD  Procedure Performed:  1. Left Heart Catheterization 2. Selective Coronary Angiography 3. Left ventricular angiogram 4. PTCA/DES x 1 Diagonal  5. IVUS Diagonal 6. IVUS LAD 7. PTCA/DES x 1 mid LAD  Operator: Verne Carrow, MD  Arterial access site:  Right radial artery.   Indication:  52 yo male with no prior known medical problems admitted with chest pain and found to have elevated troponin c/w NSTEMI.                                    Procedure Details: The risks, benefits, complications, treatment options, and expected outcomes were discussed with the patient. The patient and/or family concurred with the proposed plan, giving informed consent. The patient was brought to the cath lab after IV hydration was begun and oral premedication was given. The patient was further sedated with Versed and Fentanyl. The right wrist was assessed with an Allens test which was positive. The right wrist was prepped and draped in a sterile fashion. 1% lidocaine was used for local anesthesia. Using the modified Seldinger access technique, a 5 French sheath was placed in the right radial artery. 3 mg Verapamil was given through the sheath. 5000 units IV heparin was given. Standard diagnostic catheters were used to perform selective coronary angiography. A pigtail catheter was used to perform a left ventricular angiogram. He was found to have a severe stenosis in the Diagonal branch and a hazy, severe stenosis in the mid LAD.   The sheath was upsized to a 6 [place] system. I then engaged the left main artery with a XB 3.0 guiding catheter. He was given Effient 60 mg po x 1. A bolus of Angiomax was given and a drip was started. When the ACT was greater than 200, I passed a BMW wire down the LAD and into the Diagonal branch. The stenosis was pre-dilated with a 2.0 x 12 mm balloon x 1. I then  deployed a 2.5 x 16 mm Promus Premier DES in the Diagonal branch. This was post-dilated with a 2.75 x 12 mm Balfour balloon. I felt that the stent did come back to the ostium but there was overlap in most angiographic views. I elected to perform an IVUS run in the Diagonal branch to confirm that the stent did cover the Diagonal branch back to the ostium. The IVUS confirmed that the stent was well expanded and indeed covered back to the ostium. The stenosis was taken from 99% down to 0%. I then turned my attention to the LAD. The same BMW wire was pulled back and then advanced into the distal LAD. IVUS imaging of the mid LAD confirmed the stenosis in the mid LAD was severe. I pre-dilated two lesions in the mid LAD with the 2.0 x 12 mm balloon x 2. I then deployed a 2.75 x 24 mm Promus Premier DES in the mid LAD, covering both severe lesions. The stent was post-dilated with a 3.0 x 12 mm balloon on the proximal edge. There was an excellent angiographic result. The stenosis was taken from 90% down to 0%.   The sheath was removed from the right radial artery and a Terumo hemostasis band was applied at the arteriotomy site on the right wrist.    There were no immediate complications. The patient was taken to the recovery area in  stable condition.   Hemodynamic Findings: Central aortic pressure: 97/60 Left ventricular pressure: 97/9/14  Angiographic Findings:  Left main: No obstructive disease noted.   Left Anterior Descending Artery: Large caliber vessel that courses to the apex. The mid LAD has two severe stenoses. The first stenosis is 90%. The second stenosis is 80%. These lesions are in the mid segment of the LAD and 5 mm apart. The first diagonal branch is small in caliber and has no obstructive disease noted. The second diagonal branch is moderate in caliber and has a 99% stenosis.   Circumflex Artery: Large caliber vessel with large first obtuse marginal branch, moderate caliber second OM branch. No  obstructive disease is noted.   Right Coronary Artery: Large dominant vessel with no obstructive disease noted.   Left Ventricular Angiogram: LVEF=55-60%  Impression: 1. Severe double vessel CAD with severe stenosis in the Diagonal branch and serial severe stenoses in the mid LAD 2. Unstable angina 3. Preserved LV systolic function 4. Successful PTCA/DES x 1 mid LAD 5. Successful PTCA/DES x 1 Diagonal branch  Recommendations: He will need dual anti-platelet therapy with ASA and Effient for at least one year. Will start statin, beta blocker.        Complications:  None. The patient tolerated the procedure well.

## 2012-06-17 NOTE — Progress Notes (Signed)
TR BAND REMOVAL  LOCATION:    right radial  DEFLATED PER PROTOCOL:    yes  TIME BAND OFF / DRESSING APPLIED:    1730   SITE UPON ARRIVAL:    Level 0  SITE AFTER BAND REMOVAL:    Level 0  REVERSE ALLEN'S TEST:     positive  CIRCULATION SENSATION AND MOVEMENT:    Within Normal Limits   yes  COMMENTS:   Tolerated procedure well 

## 2012-06-17 NOTE — Progress Notes (Signed)
ANTICOAGULATION CONSULT NOTE   Pharmacy Consult for UFH Indication: NSTEMI  No Known Allergies  Patient Measurements: Height: 5' 9.5" (176.5 cm) Weight: 231 lb 7.7 oz (105 kg) IBW/kg (Calculated) : 71.85 Heparin Dosing Weight: 95kg  Vital Signs: Temp: 97.8 F (36.6 C) (02/25 0544) Temp src: Oral (02/25 0544) BP: 110/67 mmHg (02/25 0544) Pulse Rate: 51 (02/24 2300)  Labs:  Recent Labs  06/16/12 1914 06/16/12 1940 06/16/12 2011 06/16/12 2103 06/16/12 2306 06/17/12 0520  HGB 14.0 14.6  --   --   --  12.8*  HCT 38.8* 43.0  --   --   --  37.0*  PLT 247  --   --   --   --  222  APTT  --   --  30  --   --   --   LABPROT  --   --  13.0  --   --   --   INR  --   --  0.99  --   --   --   HEPARINUNFRC  --   --   --   --   --  0.34  CREATININE  --  1.30  --   --   --   --   TROPONINI  --   --   --  0.72* 1.15*  --     Estimated Creatinine Clearance: 79.1 ml/min (by C-G formula based on Cr of 1.3).  Assessment: 52 y/o male with chest pain for heparin  Goal of Therapy:  Heparin level 0.3-0.7 units/ml Monitor platelets by anticoagulation protocol: Yes   Plan:  Continue Heparin at current rate F/U after cath  Geannie Risen, PharmD, BCPS 06/17/2012,6:26 AM

## 2012-06-17 NOTE — Interval H&P Note (Signed)
History and Physical Interval Note:  06/17/2012 12:01 PM  Joshua King  has presented today for cardiac cath with possible PCI  with the diagnosis of NSTEMI/chest pain.  The various methods of treatment have been discussed with the patient and family. After consideration of risks, benefits and other options for treatment, the patient has consented to  Procedure(s): LEFT HEART CATHETERIZATION WITH CORONARY ANGIOGRAM (N/A) as a surgical intervention .  The patient's history has been reviewed, patient examined, no change in status, stable for surgery.  I have reviewed the patient's chart and labs.  Questions were answered to the patient's satisfaction.     [name]

## 2012-06-18 ENCOUNTER — Encounter (HOSPITAL_COMMUNITY): Payer: Self-pay | Admitting: Physician Assistant

## 2012-06-18 DIAGNOSIS — I214 Non-ST elevation (NSTEMI) myocardial infarction: Secondary | ICD-10-CM

## 2012-06-18 DIAGNOSIS — I2510 Atherosclerotic heart disease of native coronary artery without angina pectoris: Secondary | ICD-10-CM

## 2012-06-18 DIAGNOSIS — R001 Bradycardia, unspecified: Secondary | ICD-10-CM

## 2012-06-18 DIAGNOSIS — I10 Essential (primary) hypertension: Secondary | ICD-10-CM

## 2012-06-18 LAB — BASIC METABOLIC PANEL
BUN: 11 mg/dL (ref 6–23)
Calcium: 9.1 mg/dL (ref 8.4–10.5)
GFR calc non Af Amer: 65 mL/min — ABNORMAL LOW (ref >90)
Glucose, Bld: 91 mg/dL (ref 70–99)
Sodium: 138 mEq/L (ref 135–145)

## 2012-06-18 LAB — CBC
HCT: 39.4 % (ref 39.0–52.0)
Hemoglobin: 13.3 g/dL (ref 13.0–17.0)
MCH: 34 pg (ref 26.0–34.0)
MCHC: 33.8 g/dL (ref 30.0–36.0)
RDW: 12.9 % (ref 11.5–15.5)

## 2012-06-18 MED ORDER — PRASUGREL HCL 10 MG PO TABS: 10.0000 mg | ORAL_TABLET | Freq: Every day | ORAL | Status: DC

## 2012-06-18 MED ORDER — LISINOPRIL 5 MG PO TABS: 5.0000 mg | ORAL_TABLET | Freq: Every day | ORAL | Status: DC

## 2012-06-18 MED ORDER — ATORVASTATIN CALCIUM 40 MG PO TABS: 40.0000 mg | ORAL_TABLET | Freq: Every day | ORAL | Status: DC

## 2012-06-18 MED ORDER — LISINOPRIL 5 MG PO TABS
5.0000 mg | ORAL_TABLET | Freq: Every day | ORAL | Status: DC
  Administered 2012-06-18: 10:00:00 5 mg via ORAL
  Filled 2012-06-18: qty 1

## 2012-06-18 MED ORDER — NITROGLYCERIN 0.4 MG SL SUBL: 0.4000 mg | SUBLINGUAL_TABLET | SUBLINGUAL | Status: DC | PRN

## 2012-06-18 MED ORDER — ATORVASTATIN CALCIUM 40 MG PO TABS
40.0000 mg | ORAL_TABLET | Freq: Every day | ORAL | Status: DC
  Administered 2012-06-18: 40 mg via ORAL
  Filled 2012-06-18 (×2): qty 1

## 2012-06-18 MED FILL — Dextrose Inj 5%: INTRAVENOUS | Qty: 50 | Status: AC

## 2012-06-18 NOTE — Discharge Summary (Signed)
See full note this am. cdm 

## 2012-06-18 NOTE — Progress Notes (Signed)
CARDIAC REHAB PHASE I   PRE:  Rate/Rhythm: 56SB  BP:  Supine:   Sitting: 147/80  Standing:    SaO2:   MODE:  Ambulation: 1000 ft   POST:  Rate/Rhythem: 71SR  BP:  Supine:   Sitting: 166/96  Standing:    SaO2:  0740-0830 Pt walked 1000 ft with steady gait at fast pace. Tolerated well. No c/o CP. Pt very active and has good diet habits. Has not had red meat in 30 years. Makes healthy choices during work travel. Education completed. Pt's work schedule and traveling do not allow for CRP 2 but pt has an active lifestyle with walking,swimming, weights,etc. Gave effient packet.   Duanne Limerick

## 2012-06-18 NOTE — Progress Notes (Signed)
    SUBJECTIVE:  BP 119/59  Pulse 54  Temp(Src) 98.3 F (36.8 C) (Oral)  Resp 18  Ht 5' 9.5" (1.765 m)  Wt 227 lb 15.3 oz (103.4 kg)  BMI 33.19 kg/m2  SpO2 99%  Intake/Output Summary (Last 24 hours) at 06/18/12 1478 Last data filed at 06/17/12 2100  Gross per 24 hour  Intake    540 ml  Output      0 ml  Net    540 ml    PHYSICAL EXAM General: Well developed, well nourished, in no acute distress. Alert and oriented x 3.  Psych:  Good affect, responds appropriately Neck: No JVD. No masses noted.  Lungs: Clear bilaterally with no wheezes or rhonci noted.  Heart: RRR with no murmurs noted. Abdomen: Bowel sounds are present. Soft, non-tender.  Extremities: No lower extremity edema.   LABS: Basic Metabolic Panel:  Recent Labs  29/56/21 1940 06/17/12 0520  NA 141 139  K 4.0 4.2  CL 104 104  CO2  --  28  GLUCOSE 91 96  BUN 11 11  CREATININE 1.30 1.29  CALCIUM  --  9.0  MG  --  2.4   CBC:  Recent Labs  06/17/12 0520 06/18/12 0630  WBC 6.0 6.2  HGB 12.8* 13.3  HCT 37.0* 39.4  MCV 99.7 100.8*  PLT 222 217   Cardiac Enzymes:  Recent Labs  06/16/12 2103 06/16/12 2306 06/17/12 0520  TROPONINI 0.72* 1.15* 0.77*   Fasting Lipid Panel:  Recent Labs  06/17/12 0520  CHOL 219*  HDL 65  LDLCALC 140*  TRIG 72  CHOLHDL 3.4    Current Meds: . aspirin EC  81 mg Oral Daily  . atorvastatin  40 mg Oral q1800  . buPROPion  150 mg Oral Daily  . lisinopril  5 mg Oral Daily  . meclizine  25 mg Oral Once  . prasugrel  10 mg Oral Daily     ASSESSMENT AND PLAN:  1. NSTEMI: Cardiac cath yesterday with severe stenosis in mid LAD and severe stenosis in moderate caliber diagonal branch. A drug eluting stent was placed in the LAD and a drug eluting stent was placed in the Diagonal branch. He is doing well post PCI. He will need ASA and Effient for one year.  Will continue Ace-inhibitor. Will start statin. No beta blocker with bradycardia.   2. Dispo: D/C home  today. Follow up with Tereso Newcomer, PA-C in 2 weeks then with me. He will need a work excuse until next Monday and an Effient starter packet. BMET is pending.   MCALHANY,CHRISTOPHER  2/26/20146:57 AM

## 2012-06-18 NOTE — Care Management Note (Signed)
    Page 1 of 1   06/18/2012     8:54:26 AM   CARE MANAGEMENT NOTE 06/18/2012  Patient:  PARKER, WHERLEY   Account Number:  [id number]  Date Initiated:  06/17/2012  Documentation initiated by:  GRAVES-BIGELOW,BRENDA  Subjective/Objective Assessment:   Pt admitted for cp- Nstemi. Post cath. Plan for home on effient when medically stable.     Action/Plan:   CM did place abenefits check in system. Will alert pt when complete.   Anticipated DC Date:  06/18/2012   Anticipated DC Plan:  HOME/SELF CARE      DC Planning Services  CM consult      Choice offered to / List presented to:             Status of service:  In process, will continue to follow Medicare Important Message given?   (If response is "NO", the following Medicare IM given date fields will be blank) Date Medicare IM given:   Date Additional Medicare IM given:    Discharge Disposition:    Per UR Regulation:  Reviewed for med. necessity/level of care/duration of stay  If discussed at Long Length of Stay Meetings, dates discussed:    Comments:  06/18/12 @ 0845 Oletta Cohn, RN, BSN, [place] [phone] Benefits check for Effient 10mg  PO:  PT COPAY WILL BE LESS THAN $10 .....Marland KitchenNO PRIOR AUTH REQUIRED Will alert patient prior to d/c home.

## 2012-06-18 NOTE — Progress Notes (Signed)
06/18/12 @ 1010 Oletta Cohn, RN, BSN, [employer] [phone] Relayed co-pay information to patient and wife.

## 2012-06-18 NOTE — Discharge Summary (Signed)
Discharge Summary   Patient ID: Joshua King,  MRN: [id number], DOB/AGE: [birth year] 53 y.o.  Admit date: 06/16/2012 Discharge date: 06/18/2012  Primary Physician: Default, Provider, MD Primary Cardiologist: New- C. Clifton Chauncey, MD  Discharge Diagnoses Principal Problem:   NSTEMI (non-ST elevated myocardial infarction)  - Cardiac cath 06/18/11-90%, then 80% mid LAD stenoses s/p DES x 1, 99% D2 lesion s/p DES x 1; LVEF 55-60%  - DAPT- ASA/Effient x 12 months Active Problems:   CAD (coronary artery disease), native coronary artery  - Discharged on ASA/Effient/ACEi/statin/NTG SL PRN  - No BB secondary to bradycardia  - To check BMET on follow-up after starting ACEi   Hypertension   - ACEi added   Dyslipidemia  - LDL of 140, HDL 65, TC 219, TG 72  - Atorvastatin 40mg  added  - Check LFTs in 6 weeks   Sinus bradycardia  - Asymptomatic, BB not started  Allergies No Known Allergies  Diagnostic Studies/Procedures  PORTABLE CHEST X-RAY - 06/16/12  IMPRESSION:  No evidence of active cardiopulmonary disease.    History of Present Illness Joshua King is a 53 y.o. male who was admitted to [facility] on 05/25/12 with the above problem list. He had no prior cardiac history. He reported that the week prior to admission he had been experiencing intermittent episodes of chest burning more so with activity which relieved with rest. Later on that same week, the chest discomfort became more frequent and was exacerbated by milder forms of activity. The date of admission, he awoke with intermittent chest burning. He was actually scheduled to attend business trip, which had to be canceled as his chest pain worsened to an 8/10. He went to Dr. Jennye Boroughs office where an EKG was performed and returned abnormal. He was subsequently transferred to the ED.   There, repeat EKG did indicate lateral T wave inversions. Initial troponin did return mildly elevated. BMET and CBC return unremarkable. A chest  x-ray as above indicated no evidence of active cardiopulmonary disease. He is a IV heparin and nitroglycerin paste was applied with improvement in discomfort. The plan was made to admit to telemetry with left heart catheterization the following day.   Hospital Course   He started on empiric atorvastatin and continued on low-dose aspirin. Beta blocker was not initiated due to asymptomatic sinus bradycardia. Cardiac enzymes were cycled and remained mildly elevated. Hemoglobin A1c returned at 5.5%. A lipid panel did indicate hyperlipidemia- LDL of 140, HDL 65, TC 219, TG 72. He remained stable overnight, and was assessed by Dr. Clifton Akin the following day. The decision was made to proceed with cath.   He was informed, consented and prepped for the procedure which was accessed via the right radial artery. As above as indicated 2 separate proximal LAD lesions and a high-grade diagonal stenosis. Two drug-eluting stents were placed successfully to these areas. EF remained preserved. He tolerated the procedure well without complications. Recommendation was made to pursue dual antiplatelet therapy-ASA/Effient x 12 months. He was transferred to CRU and remained stable overnight. He was started on ACEi. He ambulated well with cardiac rehabilitation the following morning. BMET and CBC returned unremarkable. He was evaluated by Dr. Clifton Manford and deemed stable for discharge. Radial site was determined to be healing well without complications.   He will be discharged on the medication regimen listed below. Up within 7 days given his NSTEMI status. BMET will be drawn at this time given ACEi initiation. LFTs will need to be checked in 6  weeks since starting atorvastatin. This information, including post cath instructions and activity restrictions, has been clearly outlined in the discharge AVS.   Discharge Vitals:  Blood pressure 119/59, pulse 54, temperature 97.7 F (36.5 C), temperature source Oral, resp. rate 18, height  5' 9.5" (1.765 m), weight 103.4 kg (227 lb 15.3 oz), SpO2 99.00%.   Weight change: -0.927 kg (-2 lb 0.7 oz)  Labs: Recent Labs     06/17/12  0520  06/18/12  0630  WBC  6.0  6.2  HGB  12.8*  13.3  HCT  37.0*  39.4  MCV  99.7  100.8*  PLT  222  217    Recent Labs Lab 06/16/12 1940 06/17/12 0520 06/18/12 0630  NA 141 139 138  K 4.0 4.2 4.0  CL 104 104 101  CO2  --  28 27  BUN 11 11 11   CREATININE 1.30 1.29 1.24  CALCIUM  --  9.0 9.1  PROT  --  6.5  --   BILITOT  --  0.8  --   ALKPHOS  --  40  --   ALT  --  22  --   AST  --  23  --   GLUCOSE 91 96 91   Recent Labs     06/17/12  0520  HGBA1C  5.5   Recent Labs     06/16/12  2103  06/16/12  2306  06/17/12  0520  TROPONINI  0.72*  1.15*  0.77*   Recent Labs     06/17/12  0520  CHOL  219*  HDL  65  LDLCALC  140*  TRIG  72  CHOLHDL  3.4   Disposition:  Discharge Orders   Future Appointments Provider Department Dept Phone   06/24/2012 11:30 AM Beatrice Lecher, PA [facility] Main Office [place]) [phone]   Future Orders Complete By Expires     Basic Metabolic Panel (BMET)  06/24/2012 06/18/2013    Questions:      Has the patient fasted?:      Diet - low sodium heart healthy  As directed     Increase activity slowly  As directed           Follow-up Information   Follow up with Tereso Newcomer, PA On 06/24/2012. (11:30 AM with labwork)    Contact information:   [address] [place] [address] [phone]      Discharge Medications:    Medication List    TAKE these medications       aspirin EC 81 MG tablet  Take 162 mg by mouth once.     atorvastatin 40 MG tablet  Commonly known as:  LIPITOR  Take 1 tablet (40 mg total) by mouth daily at 6 PM.     buPROPion 150 MG 24 hr tablet  Commonly known as:  WELLBUTRIN XL  Take 150 mg by mouth daily.     lisinopril 5 MG tablet  Commonly known as:  PRINIVIL,ZESTRIL  Take 1 tablet (5 mg total) by mouth daily.     meclizine  25 MG tablet  Commonly known as:  ANTIVERT  Take 25 mg by mouth once.     nitroGLYCERIN 0.4 MG SL tablet  Commonly known as:  NITROSTAT  Place 1 tablet (0.4 mg total) under the tongue every 5 (five) minutes x 3 doses as needed for chest pain.     prasugrel 10 MG Tabs  Commonly known as:  EFFIENT  Take 1  tablet (10 mg total) by mouth daily.      ASK your doctor about these medications       diazepam 2 MG tablet  Commonly known as:  VALIUM  Take 0.5 mg by mouth once.       Outstanding Labs/Studies: BMET on 06/24/12, LFTs in 6 weeks  Duration of Discharge Encounter: Greater than 30 minutes including physician time.  Signed, R. Hurman Horn, PA-C 06/18/2012, 10:00 AM

## 2012-06-20 ENCOUNTER — Telehealth: Payer: Self-pay | Admitting: *Deleted

## 2012-06-20 NOTE — Telephone Encounter (Signed)
Called patient to follow up on recent hospital stay at [facility]. Patient feeling well and walking 30 minutes past 2 days. Not back to work yet. Taking medications as prescribed. Aware of appointment on 3/4 with Tereso Newcomer PA.

## 2012-06-24 ENCOUNTER — Encounter: Payer: Self-pay | Admitting: *Deleted

## 2012-06-24 ENCOUNTER — Ambulatory Visit (INDEPENDENT_AMBULATORY_CARE_PROVIDER_SITE_OTHER): Payer: BC Managed Care – PPO | Admitting: Physician Assistant

## 2012-06-24 ENCOUNTER — Telehealth: Payer: Self-pay | Admitting: *Deleted

## 2012-06-24 ENCOUNTER — Encounter: Payer: Self-pay | Admitting: Physician Assistant

## 2012-06-24 VITALS — BP 118/80 | HR 65 | Ht 69.5 in | Wt 229.8 lb

## 2012-06-24 DIAGNOSIS — F419 Anxiety disorder, unspecified: Secondary | ICD-10-CM

## 2012-06-24 DIAGNOSIS — E785 Hyperlipidemia, unspecified: Secondary | ICD-10-CM

## 2012-06-24 DIAGNOSIS — I214 Non-ST elevation (NSTEMI) myocardial infarction: Secondary | ICD-10-CM

## 2012-06-24 DIAGNOSIS — F411 Generalized anxiety disorder: Secondary | ICD-10-CM

## 2012-06-24 DIAGNOSIS — I2510 Atherosclerotic heart disease of native coronary artery without angina pectoris: Secondary | ICD-10-CM

## 2012-06-24 DIAGNOSIS — R079 Chest pain, unspecified: Secondary | ICD-10-CM

## 2012-06-24 LAB — BASIC METABOLIC PANEL
BUN: 20 mg/dL (ref 6–23)
Chloride: 103 mEq/L (ref 96–112)
Creatinine, Ser: 1.2 mg/dL (ref 0.4–1.5)
Glucose, Bld: 90 mg/dL (ref 70–99)
Potassium: 4.3 mEq/L (ref 3.5–5.1)

## 2012-06-24 MED ORDER — ASPIRIN EC 81 MG PO TBEC: 81.0000 mg | DELAYED_RELEASE_TABLET | Freq: Once | ORAL | Status: DC

## 2012-06-24 NOTE — Telephone Encounter (Signed)
Message copied by Tarri Fuller on Tue Jun 24, 2012  5:03 PM ------      Message from: [place], [place] T      Created: Tue Jun 24, 2012  3:46 PM       Potassium and kidney function look good.      Continue with current treatment plan.      Tereso Newcomer, PA-C  4:49 PM 03/06/2012 ------

## 2012-06-24 NOTE — Addendum Note (Signed)
Addended by: Tarri Fuller on: 06/24/2012 02:41 PM   Modules accepted: Orders

## 2012-06-24 NOTE — Patient Instructions (Addendum)
LAB TODAY; BMET  OK TO TAKE OMEPRAZOLE ONE TIME DAILY FOR 2 WEEKS THEN STOP  DECREASE ASPIRIN TO 81 MG DAILY  YOU HAVE BEEN GIVEN A WORK NOTE  PLEASE FOLLOW UP WITH DR. Clifton Redell ON Monday 09/01/12 @ 10:15  PT HAVE FLP/LFT SOMETIME IN MAY BEFORE DR. MCALHANY APPT

## 2012-06-24 NOTE — Telephone Encounter (Signed)
pt notified about labs results from today with verbal understanding

## 2012-06-24 NOTE — Progress Notes (Signed)
[address], [address] [place], [place]  [address] Phone: [phone], Fax:  [phone]  Date:  06/24/2012   ID:  Joshua King, DOB Jun 20, 1960, MRN [id number]  PCP:  Saran Laviolette, MD  Primary Cardiologist:  Dr. Verne Carrow     History of Present Illness: Joshua King is a 52 y.o. male who returns for followup after a recent admission to the hospital for a non-STEMI. He was admitted 2/24-2/26 after presenting with chest pain and abnormal troponins. LHC 06/17/12: Mid LAD 90% and 80%, D2 99%, EF 55-60%. PCI: Promus Premier DES to the mid LAD and Promus Premier DES to the D2.  No beta blocker was used due to bradycardia.  He was placed on an ACE and needs DAPT x 1 year.  Since d/c, he is doing well.  He is hesitant to return to work too soon.  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  He would like to remain out of work for another week.  He denies any recurrent angina.  He has had a few sensations in his left chest.  It is not pain.  Not related to meals.  No exertional component.  No assoc dyspnea, nausea, diaphoresis.  No syncope.  No orthopnea, PND, edema.  He is not interested in cardiac rehab.  He was working out quite a bit prior to his MI and plans to gradually return to his prior activities on his own.  Labs (2/14):  K 4, creatinine 1.24, ALT 22, LDL 140, Hgb 13.3 (MCV 100.8)  Wt Readings from Last 3 Encounters:  06/24/12 229 lb 12.8 oz (104.237 kg)  06/18/12 227 lb 15.3 oz (103.4 kg)  06/18/12 227 lb 15.3 oz (103.4 kg)     Past Medical History  Diagnosis Date  . GERD (gastroesophageal reflux disease)   . CAD (coronary artery disease), native coronary artery 05/2012    a. s/p NSTEMI 2/14: LHC 06/17/12: Mid LAD 90% and 80%, D2 99%, EF 55-60%. PCI: Promus Premier DES to the mid LAD and Promus Premier DES to the D2  . Hyperlipidemia   . Hypertension   . Sinus bradycardia     Current Outpatient Prescriptions  Medication Sig Dispense  Refill  . aspirin EC 81 MG tablet Take 162 mg by mouth once.      Marland Kitchen atorvastatin (LIPITOR) 40 MG tablet Take 1 tablet (40 mg total) by mouth daily at 6 PM.  30 tablet  3  . buPROPion (WELLBUTRIN XL) 150 MG 24 hr tablet Take 150 mg by mouth daily.      . diazepam (VALIUM) 2 MG tablet Take 0.5 mg by mouth every 8 (eight) hours as needed.       Marland Kitchen lisinopril (PRINIVIL,ZESTRIL) 5 MG tablet Take 1 tablet (5 mg total) by mouth daily.  30 tablet  3  . meclizine (ANTIVERT) 25 MG tablet Take 25 mg by mouth once.      . nitroGLYCERIN (NITROSTAT) 0.4 MG SL tablet Place 1 tablet (0.4 mg total) under the tongue every 5 (five) minutes x 3 doses as needed for chest pain.  25 tablet  3  . prasugrel (EFFIENT) 10 MG TABS Take 1 tablet (10 mg total) by mouth daily.  30 tablet  3   No current facility-administered medications for this visit.    Allergies:   No Known Allergies  Social History:  The patient  reports that he has never smoked. He has never used smokeless tobacco. He  reports that he drinks about 3.0 ounces of alcohol per week.   ROS:  Please see the history of present illness.   All other systems reviewed and negative.   PHYSICAL EXAM: VS:  BP 118/80  Pulse 65  Ht 5' 9.5" (1.765 m)  Wt 229 lb 12.8 oz (104.237 kg)  BMI 33.46 kg/m2 Well nourished, well developed, in no acute distress HEENT: normal Neck: no JVD Cardiac:  normal S1, S2; RRR; no murmur Lungs:  clear to auscultation bilaterally, no wheezing, rhonchi or rales Abd: soft, nontender, no hepatomegaly Ext: no edema; right wrist without hematoma or bruit  Skin: warm and dry Neuro:  CNs 2-12 intact, no focal abnormalities noted  EKG:  NSR, HR 65, normal axis, NSSTTW changes     ASSESSMENT AND PLAN:  1. Coronary Artery Disease:  Doing well after recent NSTEMI treated with DES to the LAD and Dx.  He has some chest discomfort that is atypical.  His ECG is normal.  I suspect he may have some dyspepsia in the setting of DAPT.  He has  omeprazole at home.  I have asked him to take this for 2 weeks and notify us if his symptoms change or worsen. He will increase activity on his own instead of cardiac rehab.  We discussed the importance of dual antiplatelet therapy.  He only needs to take ASA 81 mg QD and will reduce his ASA to this.  Check BMET today to follow up on renal fxn and K+ since starting an ACE inhibitor.  I have given him a note to return to work in 1 week at 1/2 days.   2. Hyperlipidemia:  Will need to arrange follow up on lipids at his next visit. 3. Anxiety:  He seems to be quite anxious and has a high stress job.  We had a long discussion today regarding slowly returning to his job to avoid high levels of stress.  He sees a NP at his company for primary care.  I have encouraged him to discuss this with her regarding further treatment for anxiety such as counseling.  He is in agreement with this.   4. Disposition:  Follow up with Dr. Verne Carrow in 6 weeks.   Signed, Tereso Newcomer, PA-C  11:45 AM 06/24/2012

## 2012-07-01 ENCOUNTER — Ambulatory Visit (INDEPENDENT_AMBULATORY_CARE_PROVIDER_SITE_OTHER): Payer: BC Managed Care – PPO | Admitting: Physician Assistant

## 2012-07-01 ENCOUNTER — Telehealth: Payer: Self-pay | Admitting: Cardiovascular Disease

## 2012-07-01 ENCOUNTER — Encounter: Payer: Self-pay | Admitting: Physician Assistant

## 2012-07-01 VITALS — BP 114/66 | HR 88 | Ht 69.5 in | Wt 232.0 lb

## 2012-07-01 DIAGNOSIS — I2510 Atherosclerotic heart disease of native coronary artery without angina pectoris: Secondary | ICD-10-CM

## 2012-07-01 DIAGNOSIS — E785 Hyperlipidemia, unspecified: Secondary | ICD-10-CM

## 2012-07-01 DIAGNOSIS — R42 Dizziness and giddiness: Secondary | ICD-10-CM

## 2012-07-01 NOTE — Patient Instructions (Addendum)
KEEP APPT WITH DR. Clifton Yan IN 08/2012  STOP LISINOPRIL AS OF TODAY

## 2012-07-01 NOTE — Telephone Encounter (Signed)
New Problem:    Patient called in wanting to be worked in to see scott today because he has some concerns he woid like to discuss.  Please call back.

## 2012-07-01 NOTE — Progress Notes (Signed)
[address] [place], [place]  [address] Phone: [phone], Fax:  [phone]  Date:  07/01/2012   ID:  Joshua King, DOB 11/27/1960, MRN [id number]  PCP:  Allexa Acoff, MD  Primary Cardiologist:  Dr. Verne Carrow     History of Present Illness: Joshua King is a 52 y.o. male who returns for evaluation of dizziness.  He was recently admitted for a non-STEMI.  LHC 06/17/12:  mLAD 90% and 80%, D2 99%, EF 55-60%. PCI: Promus Premier DES to the mid LAD and Promus Premier DES to the D2.  He has not been treated with a beta blocker due to bradycardia.  I saw him last week and he was fairly stable.  He was to see Dr. Verne Carrow in 6 weeks.  He went back to work at 1/2 days this week.  Has noted several episodes of lightheadedness.  Typically occurs shortly after standing.  No syncope.  No symptoms c/w prior angina.  Does note occasional pains in lateral chest as well as his jaw.  He has a problem with grinding his teeth in his sleep.  No dyspnea.  No orthopnea, PND, edema.  He is exercising on his elliptical without chest pain or dyspnea and actually feels a lot better than he did in the months prior to his MI.   Labs (2/14):  K 4, creatinine 1.24, ALT 22, LDL 140, Hgb 13.3 (MCV 100.8) Labs (3/14):  K 4.3, creatinine 1.2   Wt Readings from Last 3 Encounters:  07/01/12 232 lb (105.235 kg)  06/24/12 229 lb 12.8 oz (104.237 kg)  06/18/12 227 lb 15.3 oz (103.4 kg)     Past Medical History  Diagnosis Date  . GERD (gastroesophageal reflux disease)   . CAD (coronary artery disease), native coronary artery 05/2012    a. s/p NSTEMI 2/14: LHC 06/17/12: Mid LAD 90% and 80%, D2 99%, EF 55-60%. PCI: Promus Premier DES to the mid LAD and Promus Premier DES to the D2  . Hyperlipidemia   . Hypertension   . Sinus bradycardia     Current Outpatient Prescriptions  Medication Sig Dispense Refill  . aspirin EC 81 MG tablet Take 1 tablet (81 mg total) by  mouth once.      Marland Kitchen atorvastatin (LIPITOR) 40 MG tablet Take 1 tablet (40 mg total) by mouth daily at 6 PM.  30 tablet  3  . buPROPion (WELLBUTRIN XL) 150 MG 24 hr tablet Take 150 mg by mouth daily.      . diazepam (VALIUM) 2 MG tablet Take 0.5 mg by mouth every 8 (eight) hours as needed.       . meclizine (ANTIVERT) 25 MG tablet Take 25 mg by mouth as needed.       . nitroGLYCERIN (NITROSTAT) 0.4 MG SL tablet Place 1 tablet (0.4 mg total) under the tongue every 5 (five) minutes x 3 doses as needed for chest pain.  25 tablet  3  . prasugrel (EFFIENT) 10 MG TABS Take 1 tablet (10 mg total) by mouth daily.  30 tablet  3   No current facility-administered medications for this visit.    Allergies:   No Known Allergies  Social History:  The patient  reports that he has never smoked. He has never used smokeless tobacco. He reports that he drinks about 3.0 ounces of alcohol per week.   ROS:  Please see the history of present illness.  No fevers, cough, melena, hematochezia, vomiting or diarrhea.  All other systems reviewed and negative.   PHYSICAL EXAM: VS:  BP 114/66  Pulse 88  Ht 5' 9.5" (1.765 m)  Wt 232 lb (105.235 kg)  BMI 33.78 kg/m2  Filed Vitals:   07/01/12 1408 07/01/12 1418 07/01/12 1419 07/01/12 1420  BP: 104/78 103/74 101/70 114/66  Pulse: 65 71 77 88  Height: 5' 9.5" (1.765 m)     Weight: 232 lb (105.235 kg)        Well nourished, well developed, in no acute distress HEENT: normal Neck: no JVD Cardiac:  normal S1, S2; RRR; no murmur Lungs:  clear to auscultation bilaterally, no wheezing, rhonchi or rales Abd: soft, nontender, no hepatomegaly Ext: no edema; right wrist without hematoma or bruit  Skin: warm and dry Neuro:  CNs 2-12 intact, no focal abnormalities noted  EKG:   NSR, HR 65, normal axis, no acute changes    ASSESSMENT AND PLAN:  1. Dizziness:  I think he is symptomatic with his low BP.  Stop Lisinopril.  He will get a BP cuff and monitor at home.  If  symptoms do not resolve, he will call us back. 2. Coronary Artery Disease:  He is stable without angina.  He does have occasional atypical symptoms of chest and jaw pain.  I believe he is more anxious than anything about his new dx of CAD.  Reassurance.  If his symptoms persist, consider stress testing. 3. Hyperlipidemia:  He has had some arthralgias.  Will see if he feels better with stopping the Lisinopril.  If not, consider trial of a different statin. 4. Disposition:  Follow up with Dr. Verne Carrow as planned.   Signed, Tereso Newcomer, PA-C  3:00 PM 07/01/2012

## 2012-07-01 NOTE — Telephone Encounter (Signed)
c/b pt and he states dizziness 5-6 x in the last 48 hours. some with position change and with walking. pt said was at work yesterday and was walking for about 2 min. and got dizzy and had to turn around to go back to work and sit down. h/o MI, bradycardia. Pt saw Tereso Newcomer, [facility] on 06/24/12 and yesterday was pt's first day back to work on 1/2 days.

## 2012-07-01 NOTE — Telephone Encounter (Signed)
c/b pt and he states dizziness 5-6 x in the last 48 hours. some with position change and with walking. pt said was at work yesterday and was walking for about 2 min. and got dizzy and had to turn around to go back to work and sit down. h/o MI, bradycardia. Pt saw Scott Weaver, PAC on 06/24/12 and yesterday was pt's first day back to work on 1/2 days. 

## 2012-07-28 ENCOUNTER — Encounter: Payer: Self-pay | Admitting: Cardiovascular Disease

## 2012-08-15 ENCOUNTER — Other Ambulatory Visit: Payer: Self-pay | Admitting: *Deleted

## 2012-08-15 MED ORDER — PRASUGREL HCL 10 MG PO TABS: 10.0000 mg | ORAL_TABLET | Freq: Every day | ORAL | Status: DC

## 2012-08-15 MED ORDER — ATORVASTATIN CALCIUM 40 MG PO TABS: 40.0000 mg | ORAL_TABLET | Freq: Every day | ORAL | Status: DC

## 2012-09-01 ENCOUNTER — Ambulatory Visit: Payer: BC Managed Care – PPO | Admitting: Cardiovascular Disease

## 2012-09-01 ENCOUNTER — Other Ambulatory Visit: Payer: BC Managed Care – PPO

## 2012-09-04 ENCOUNTER — Ambulatory Visit (INDEPENDENT_AMBULATORY_CARE_PROVIDER_SITE_OTHER): Payer: BC Managed Care – PPO | Admitting: Cardiovascular Disease

## 2012-09-04 ENCOUNTER — Encounter: Payer: Self-pay | Admitting: Cardiovascular Disease

## 2012-09-04 ENCOUNTER — Ambulatory Visit (INDEPENDENT_AMBULATORY_CARE_PROVIDER_SITE_OTHER): Payer: BC Managed Care – PPO | Admitting: *Deleted

## 2012-09-04 VITALS — BP 112/62 | HR 51 | Ht 69.0 in | Wt 229.0 lb

## 2012-09-04 DIAGNOSIS — I214 Non-ST elevation (NSTEMI) myocardial infarction: Secondary | ICD-10-CM

## 2012-09-04 DIAGNOSIS — I2510 Atherosclerotic heart disease of native coronary artery without angina pectoris: Secondary | ICD-10-CM

## 2012-09-04 DIAGNOSIS — R079 Chest pain, unspecified: Secondary | ICD-10-CM

## 2012-09-04 DIAGNOSIS — E785 Hyperlipidemia, unspecified: Secondary | ICD-10-CM

## 2012-09-04 DIAGNOSIS — I249 Acute ischemic heart disease, unspecified: Secondary | ICD-10-CM

## 2012-09-04 DIAGNOSIS — I10 Essential (primary) hypertension: Secondary | ICD-10-CM

## 2012-09-04 DIAGNOSIS — I200 Unstable angina: Secondary | ICD-10-CM

## 2012-09-04 LAB — LIPID PANEL
Cholesterol: 152 mg/dL (ref 0–200)
LDL Cholesterol: 84 mg/dL (ref 0–99)
Total CHOL/HDL Ratio: 3
VLDL: 10.4 mg/dL (ref 0.0–40.0)

## 2012-09-04 LAB — BASIC METABOLIC PANEL
BUN: 15 mg/dL (ref 6–23)
CO2: 23 mEq/L (ref 19–32)
Chloride: 106 mEq/L (ref 96–112)
Creatinine, Ser: 1.2 mg/dL (ref 0.4–1.5)
Glucose, Bld: 83 mg/dL (ref 70–99)
Potassium: 4.3 mEq/L (ref 3.5–5.1)

## 2012-09-04 LAB — HEPATIC FUNCTION PANEL
Alkaline Phosphatase: 38 U/L — ABNORMAL LOW (ref 39–117)
Bilirubin, Direct: 0 mg/dL (ref 0.0–0.3)
Total Bilirubin: 1 mg/dL (ref 0.3–1.2)
Total Protein: 7.2 g/dL (ref 6.0–8.3)

## 2012-09-04 NOTE — Patient Instructions (Addendum)
Your physician wants you to follow-up in:  6 months. You will receive a reminder letter in the mail two months in advance. If you don't receive a letter, please call our office to schedule the follow-up appointment.  Your physician has requested that you have an exercise tolerance test. For further information please visit [email or web address]. Please also follow instruction sheet, as given.    

## 2012-09-04 NOTE — Progress Notes (Signed)
History of Present Illness: 52 yo male with history of CAD, HTN, HLD, GERD here today for cardiac follow up. He was admitted to [facility] 06/17/12 with a NSTEMI. LHC 06/17/12: mid LAD 90% and 80%, D2 99%, EF 55-60%. PCI: Promus Premier DES to the mid LAD and Promus Premier DES to the D2. He has not been treated with a beta blocker due to bradycardia. His Lisinopril was stopped March 2014 at visit with Tereso Newcomer, PA-C due to dizziness and hypotension.   He is here today for follow up.  Dizziness has resolved off of Lisinopril. Over last three days he has had occasional burning in his left chest wall with radiation to his back and left shoulder. Mostly at rest. No exertional chest pains. No SOB. Exercising every day. Not like his anginal pain.  Primary Care Physician:   Last Lipid Profile:Lipid Panel     Component Value Date/Time   CHOL 219* 06/17/2012 0520   TRIG 72 06/17/2012 0520   HDL 65 06/17/2012 0520   CHOLHDL 3.4 06/17/2012 0520   VLDL 14 06/17/2012 0520   LDLCALC 140* 06/17/2012 0520     Past Medical History  Diagnosis Date  . GERD (gastroesophageal reflux disease)   . CAD (coronary artery disease), native coronary artery 05/2012    a. s/p NSTEMI 2/14: LHC 06/17/12: Mid LAD 90% and 80%, D2 99%, EF 55-60%. PCI: Promus Premier DES to the mid LAD and Promus Premier DES to the D2  . Hyperlipidemia   . Hypertension   . Sinus bradycardia     Past Surgical History  Procedure Laterality Date  . Appendectomy  1992  . Hernia repair  1999    inguinal  . Coronary angioplasty with stent placement  05/2012    90% then 80% mid LAD stenoses s/p DES x 1, 99% D2 lesion s/p DES x 1; LVEF 55-60%    Current Outpatient Prescriptions  Medication Sig Dispense Refill  . aspirin EC 81 MG tablet Take 1 tablet (81 mg total) by mouth once.      Marland Kitchen atorvastatin (LIPITOR) 40 MG tablet Take 1 tablet (40 mg total) by mouth daily at 6 PM.  90 tablet  1  . buPROPion (WELLBUTRIN XL) 150 MG 24 hr tablet Take 150  mg by mouth daily.      . meclizine (ANTIVERT) 25 MG tablet Take 25 mg by mouth as needed.       . nitroGLYCERIN (NITROSTAT) 0.4 MG SL tablet Place 1 tablet (0.4 mg total) under the tongue every 5 (five) minutes x 3 doses as needed for chest pain.  25 tablet  3  . prasugrel (EFFIENT) 10 MG TABS Take 1 tablet (10 mg total) by mouth daily.  90 tablet  1   No current facility-administered medications for this visit.    No Known Allergies  History   Social History  . Marital Status: Married    Spouse Name: N/A    Number of Children: 2  . Years of Education: N/A   Occupational History  .     Social History Main Topics  . Smoking status: Never Smoker   . Smokeless tobacco: Never Used  . Alcohol Use: 3 - 4 oz/week    6-8 drink(s) per week  . Drug Use: Not on file  . Sexually Active: Not on file   Other Topics Concern  . Not on file   Social History Narrative  . No narrative on file    Family History  Problem Relation Age of Onset  . CAD Father 56  . Heart attack Father     Review of Systems:  As stated in the HPI and otherwise negative.   BP 112/62  Pulse 51  Ht 5\' 9"  (1.753 m)  Wt 229 lb (103.874 kg)  BMI 33.8 kg/m2  SpO2 98%  Physical Examination: General: Well developed, well nourished, NAD HEENT: OP clear, mucus membranes moist SKIN: warm, dry. No rashes. Neuro: No focal deficits Musculoskeletal: Muscle strength 5/5 all ext Psychiatric: Mood and affect normal Neck: No JVD, no carotid bruits, no thyromegaly, no lymphadenopathy. Lungs:Clear bilaterally, no wheezes, rhonci, crackles Cardiovascular: Regular rate and rhythm. No murmurs, gallops or rubs. Abdomen:Soft. Bowel sounds present. Non-tender.  Extremities: No lower extremity edema. Pulses are 2 + in the bilateral DP/PT.  EKG: Sinus brady, rate 49 bpm. Poor R wave progression septal leads, no change.   Assessment and Plan:   1. CAD: He is stable. Atypical left shoulder burning with radiation to back,  mostly at rest. I do not think this is cardiac related. With recent LAD and Diagonal stenting, will arrange exercise stress test. (POET).Continue ASA and Effient. Continue statin. He does not tolerate beta blockers due to bradycardia and does not tolerate Ace-inh due to hypotension.   2. HTN: BP well controlled.   3. Hyperlipidemia: Will check lipids and LFTs today. Continue statin.

## 2012-09-18 ENCOUNTER — Ambulatory Visit (INDEPENDENT_AMBULATORY_CARE_PROVIDER_SITE_OTHER): Payer: BC Managed Care – PPO | Admitting: Cardiovascular Disease

## 2012-09-18 DIAGNOSIS — I2510 Atherosclerotic heart disease of native coronary artery without angina pectoris: Secondary | ICD-10-CM

## 2012-09-18 DIAGNOSIS — R079 Chest pain, unspecified: Secondary | ICD-10-CM

## 2012-09-18 NOTE — Progress Notes (Signed)
Exercise Treadmill Test  Pre-Exercise Testing Evaluation Rhythm: sinus bradycardia  Rate: 53     Test  Exercise Tolerance Test Ordering MD: Melene Muller, MD  Interpreting MD: Melene Muller, MD  Unique Test No: 1  Treadmill:  1  Indication for ETT: chest pain - rule out ischemia  Contraindication to ETT: No   Stress Modality: exercise - treadmill  Cardiac Imaging Performed: non   Protocol: standard Bruce - maximal  Max BP:  186/79  Max MPHR (bpm): 166 85% MPR (bpm):141  MPHR obtained (bpm):  141 % MPHR obtained:  85  Reached 85% MPHR (min:sec):  11:30 Total Exercise Time (min-sec):  12:00  Workload in METS:  13.4 Borg Scale: 16  Reason ETT Terminated:  fatigue    ST Segment Analysis At Rest: normal ST segments - no evidence of significant ST depression With Exercise: no evidence of significant ST depression  Other Information Arrhythmia:  No Angina during ETT:  absent (0) Quality of ETT:  non-diagnostic  ETT Interpretation:  normal - no evidence of ischemia by ST analysis  Comments: The patient exercised for 12 minutes of the Bruce protocol. There were no ischemic EKG changes. He had no chest pain with exercise.   Recommendations: No further ischemic workup.

## 2012-12-09 ENCOUNTER — Encounter: Payer: Self-pay | Admitting: Family Medicine

## 2012-12-09 ENCOUNTER — Ambulatory Visit (INDEPENDENT_AMBULATORY_CARE_PROVIDER_SITE_OTHER): Payer: BC Managed Care – PPO | Admitting: Family Medicine

## 2012-12-09 VITALS — BP 120/82 | HR 61 | Temp 98.1°F | Ht 69.5 in | Wt 230.0 lb

## 2012-12-09 DIAGNOSIS — E785 Hyperlipidemia, unspecified: Secondary | ICD-10-CM

## 2012-12-09 DIAGNOSIS — I2510 Atherosclerotic heart disease of native coronary artery without angina pectoris: Secondary | ICD-10-CM

## 2012-12-09 DIAGNOSIS — G4733 Obstructive sleep apnea (adult) (pediatric): Secondary | ICD-10-CM | POA: Insufficient documentation

## 2012-12-09 NOTE — Progress Notes (Signed)
Subjective:    Patient ID: Joshua King, male    DOB: [birth year], 52 y.o.   MRN: [id number]  HPI Patient here to establish care. Earlier this year, he developed some exertional chest symptoms. He initially he felt was reflux related but eventually was referred for cardiac workup.  Had non-STEMI and ended up with 2 cardiac stents .  He has done very well since then. He is currently taking Lipitor, aspirin, and Effient.  No recurrent chest pains. He exercises regularly. Nonsmoker. Lipids been followed by cardiology and well-controlled  Other medical problems include history of obstructive sleep apnea. He uses CPAP consistently. He's been followed by urology and reportedly had normal PSAs over recent years. He had severe episode of vertigo back in November 2013. He was evaluated at the [facility] and had extensive evaluation including CT head and MRI we do not have those reports but they were reportedly normal. Also very transient episode of vomiting which resolved without intervention  Patient states he recently had some screening lab work through his employer. We are waiting for those records at this time. He works for [employer] in [employer] account [employer]. No history of smoking. Occasional alcohol use.  He has never been formally diagnosed with ADD but placed on low-dose bupropion which does help his focus. This was started by nurse practitioner who works with his employer. Patient's had some ongoing right hip issues with apparently some type of an impingement process and this has limited some of his activity with exercise. He recently had steroid injection which helped.  Past Medical History  Diagnosis Date  . GERD (gastroesophageal reflux disease)   . CAD (coronary artery disease), native coronary artery 05/2012    a. s/p NSTEMI 2/14: LHC 06/17/12: Mid LAD 90% and 80%, D2 99%, EF 55-60%. PCI: Promus Premier DES to the mid LAD and Promus Premier DES to the D2  . Hyperlipidemia   . Hypertension    . Sinus bradycardia   . Chicken pox    Past Surgical History  Procedure Laterality Date  . Appendectomy  1992  . Hernia repair  1999    inguinal  . Coronary angioplasty with stent placement  05/2012    90% then 80% mid LAD stenoses s/p DES x 1, 99% D2 lesion s/p DES x 1; LVEF 55-60%    reports that he has never smoked. He has never used smokeless tobacco. He reports that he drinks about 3.0 ounces of alcohol per week. He reports that he does not use illicit drugs. family history includes Alcohol abuse in his mother; Arthritis in his father; CAD (age of onset: 56) in his father; Cancer in his father; Heart attack in his father; Heart disease in his father; Hypertension in his mother. No Known Allergies    Review of Systems  Constitutional: Negative for fatigue and unexpected weight change.  Eyes: Negative for visual disturbance.  Respiratory: Negative for cough, chest tightness and shortness of breath.   Cardiovascular: Negative for chest pain, palpitations and leg swelling.  Genitourinary: Negative for dysuria.  Neurological: Negative for dizziness, syncope, weakness, light-headedness and headaches.       Objective:   Physical Exam  Constitutional: He appears well-developed and well-nourished.  HENT:  Right Ear: External ear normal.  Left Ear: External ear normal.  Mouth/Throat: Oropharynx is clear and moist.  Neck: Neck supple. No thyromegaly present.  Cardiovascular: Normal rate and regular rhythm.  Exam reveals no gallop.   Pulmonary/Chest: Effort normal and breath sounds normal. No  respiratory distress. He has no wheezes. He has no rales.  Musculoskeletal: He exhibits no edema.          Assessment & Plan:  #1 history of CAD with previous non-ST elevation MI and 2 cardiac stents. No recent concerning symptoms #2 dyslipidemia. Lipids stable at goal on atorvastatin #3 history of obstructive sleep apnea treated with CPAP #4 right hip pain with history of degenerative  changes and impingement process. Follow closely by orthopedics. #5 health maintenance. Patient will schedule complete physical

## 2013-01-08 ENCOUNTER — Encounter: Payer: BC Managed Care – PPO | Admitting: Family Medicine

## 2013-01-28 ENCOUNTER — Encounter: Payer: Self-pay | Admitting: Family Medicine

## 2013-01-28 ENCOUNTER — Ambulatory Visit (INDEPENDENT_AMBULATORY_CARE_PROVIDER_SITE_OTHER): Payer: BC Managed Care – PPO | Admitting: Family Medicine

## 2013-01-28 VITALS — BP 128/82 | HR 66 | Temp 97.8°F | Ht 69.5 in | Wt 231.0 lb

## 2013-01-28 DIAGNOSIS — Z0000 Encounter for general adult medical examination without abnormal findings: Secondary | ICD-10-CM

## 2013-01-28 DIAGNOSIS — D7589 Other specified diseases of blood and blood-forming organs: Secondary | ICD-10-CM

## 2013-01-28 DIAGNOSIS — Z23 Encounter for immunization: Secondary | ICD-10-CM

## 2013-01-28 DIAGNOSIS — E669 Obesity, unspecified: Secondary | ICD-10-CM | POA: Insufficient documentation

## 2013-01-28 LAB — VITAMIN B12: Vitamin B-12: 233 pg/mL (ref 211–911)

## 2013-01-28 NOTE — Progress Notes (Signed)
Subjective:    Patient ID: Joshua King, male    DOB: 05-24-1960, 52 y.o.   MRN: [id number]  HPI Patient here for complete physical Refer to prior note. History of CAD with non-ST elevation MI several months ago. Had a couple of stents placed then. Done well since that time. He is exercising with swimming several days per week. He also has history of hypertension, dyslipidemia, obstructive sleep apnea.  He has had some steady weight gain over the past few years. Previously ran regularly but has had some right hip problems followed by orthopedics. Denies any recent chest pain with swimming. For the most part, his dietary compliance has been good but he does drink about 2 occasionally 3 glasses of wine per night.  He has several recent screening labs are done through his work and these were reviewed with patient. These were notable for elevated MCV with normal hemoglobin. Other labs including PSA were normal.  Patient is a nonsmoker. He remains on aspirin, Lipitor, Effient, and Wellbutrin.  Past Medical History  Diagnosis Date  . GERD (gastroesophageal reflux disease)   . CAD (coronary artery disease), native coronary artery 05/2012    a. s/p NSTEMI 2/14: LHC 06/17/12: Mid LAD 90% and 80%, D2 99%, EF 55-60%. PCI: Promus Premier DES to the mid LAD and Promus Premier DES to the D2  . Hyperlipidemia   . Hypertension   . Sinus bradycardia   . Chicken pox    Past Surgical History  Procedure Laterality Date  . Appendectomy  1992  . Hernia repair  1999    inguinal  . Coronary angioplasty with stent placement  05/2012    90% then 80% mid LAD stenoses s/p DES x 1, 99% D2 lesion s/p DES x 1; LVEF 55-60%    reports that he has never smoked. He has never used smokeless tobacco. He reports that he drinks about 3.0 ounces of alcohol per week. He reports that he does not use illicit drugs. family history includes Alcohol abuse in his mother; Arthritis in his father; CAD (age of onset: 17) in his  father; Cancer in his father; Heart attack in his father; Heart disease in his father; Hypertension in his mother. No Known Allergies    Review of Systems  Constitutional: Negative for fever, activity change, appetite change, fatigue and unexpected weight change.  HENT: Negative for congestion, ear pain and trouble swallowing.   Eyes: Negative for pain and visual disturbance.  Respiratory: Negative for cough, shortness of breath and wheezing.   Cardiovascular: Negative for chest pain and palpitations.  Gastrointestinal: Negative for nausea, vomiting, abdominal pain, diarrhea, constipation, blood in stool, abdominal distention and rectal pain.  Endocrine: Negative for polydipsia and polyuria.  Genitourinary: Negative for dysuria, hematuria and testicular pain.  Musculoskeletal: Negative for arthralgias and joint swelling.  Skin: Negative for rash.  Neurological: Negative for dizziness, syncope and headaches.  Hematological: Negative for adenopathy.  Psychiatric/Behavioral: Negative for confusion and dysphoric mood.       Objective:   Physical Exam  Constitutional: He is oriented to person, place, and time. He appears well-developed and well-nourished. No distress.  HENT:  Head: Normocephalic and atraumatic.  Right Ear: External ear normal.  Left Ear: External ear normal.  Mouth/Throat: Oropharynx is clear and moist.  Eyes: Conjunctivae and EOM are normal. Pupils are equal, round, and reactive to light.  Neck: Normal range of motion. Neck supple. No thyromegaly present.  Cardiovascular: Normal rate, regular rhythm and normal heart sounds.  No murmur heard. Pulmonary/Chest: No respiratory distress. He has no wheezes. He has no rales.  Abdominal: Soft. Bowel sounds are normal. He exhibits no distension and no mass. There is no tenderness. There is no rebound and no guarding.  Genitourinary:  Followed by urologist so rectal exam not done  Musculoskeletal: He exhibits no edema.   Lymphadenopathy:    He has no cervical adenopathy.  Neurological: He is alert and oriented to person, place, and time. He displays normal reflexes. No cranial nerve deficit.  Skin: No rash noted.  No concerning skin lesions  Psychiatric: He has a normal mood and affect.          Assessment & Plan:  Complete physical. Labs reviewed with patient. Add B12 to evaluate macrocytosis, though we suspect this is probably related to alcohol use. His recent TSH was normal. We discussed the importance of scaling back alcohol use especially in terms of trying to lose some weight. Continue regular exercise habits. He'll return in one month for weight recheck. With history of CAD, Pneumovax given. Already had flu vaccine at work

## 2013-01-28 NOTE — Patient Instructions (Signed)
Lose some weight! Scale back wine to one glass daily.

## 2013-02-23 ENCOUNTER — Encounter: Payer: Self-pay | Admitting: Physician Assistant

## 2013-02-23 ENCOUNTER — Ambulatory Visit (INDEPENDENT_AMBULATORY_CARE_PROVIDER_SITE_OTHER): Payer: BC Managed Care – PPO | Admitting: Physician Assistant

## 2013-02-23 VITALS — BP 134/102 | HR 68 | Ht 69.5 in | Wt 233.0 lb

## 2013-02-23 DIAGNOSIS — I2510 Atherosclerotic heart disease of native coronary artery without angina pectoris: Secondary | ICD-10-CM

## 2013-02-23 DIAGNOSIS — R1013 Epigastric pain: Secondary | ICD-10-CM

## 2013-02-23 DIAGNOSIS — I10 Essential (primary) hypertension: Secondary | ICD-10-CM

## 2013-02-23 DIAGNOSIS — K3189 Other diseases of stomach and duodenum: Secondary | ICD-10-CM

## 2013-02-23 DIAGNOSIS — E785 Hyperlipidemia, unspecified: Secondary | ICD-10-CM

## 2013-02-23 MED ORDER — LISINOPRIL 5 MG PO TABS: 2.5000 mg | ORAL_TABLET | Freq: Every day | ORAL | Status: DC

## 2013-02-23 NOTE — Progress Notes (Signed)
[address], [address] [place], [place]  [address] Phone: [phone] Fax:  [phone]  Date:  02/23/2013   ID:  Joshua King, DOB January 19, 1961, MRN [id number]  PCP:  Kristian Covey, MD  Cardiologist:  Dr. Verne Carrow     History of Present Illness: Joshua King is a 52 y.o. male was admitted for a non-STEMI in 05/2012.  LHC demonstrated mLAD 90% and 80%, D2 99%, EF 55-60%. PCI: Promus Premier DES to the mid LAD and Promus Premier DES to the D2. He was not treated with a beta blocker due to bradycardia.  ETT (5/14): Patient exercised for 12 minutes; no ischemic changes.  Last seen by Dr. Verne Carrow in 08/2012.  He has noted elevated blood pressures over the last several weeks. He does admit to increased stress as well as lack of exercise recently and increased traveling. He has occasional left-sided chest discomfort that is described as a burning after eating certain meals. He denies any recurrent angina. He denies significant dyspnea. He denies orthopnea, PND or edema. He denies syncope. He does note occasional bruising.   Recent Labs: 06/18/2012: Hemoglobin 13.3  09/04/2012: ALT 29; Creatinine 1.2; HDL 58.00; LDL (calc) 84; Potassium 4.3  12/16/2012:  K 4.5, creatinine 1.24, ALT 32, Hgb 13.9, LDL 97, TSH 2.860  Wt Readings from Last 3 Encounters:  02/23/13 233 lb (105.688 kg)  01/28/13 231 lb (104.781 kg)  12/09/12 230 lb (104.327 kg)     Past Medical History  Diagnosis Date  . GERD (gastroesophageal reflux disease)   . CAD (coronary artery disease), native coronary artery 05/2012    a. s/p NSTEMI 2/14: LHC 06/17/12: Mid LAD 90% and 80%, D2 99%, EF 55-60%. PCI: Promus Premier DES to the mid LAD and Promus Premier DES to the D2  . Hyperlipidemia   . Hypertension   . Sinus bradycardia   . Chicken pox     Current Outpatient Prescriptions  Medication Sig Dispense Refill  . aspirin EC 81 MG tablet Take 1 tablet (81 mg total) by mouth once.      Marland Kitchen  atorvastatin (LIPITOR) 40 MG tablet Take 1 tablet (40 mg total) by mouth daily at 6 PM.  90 tablet  1  . buPROPion (WELLBUTRIN XL) 150 MG 24 hr tablet Take 150 mg by mouth daily.      . nitroGLYCERIN (NITROSTAT) 0.4 MG SL tablet Place 1 tablet (0.4 mg total) under the tongue every 5 (five) minutes x 3 doses as needed for chest pain.  25 tablet  3  . prasugrel (EFFIENT) 10 MG TABS Take 1 tablet (10 mg total) by mouth daily.  90 tablet  1   No current facility-administered medications for this visit.    Allergies:   Review of patient's allergies indicates no known allergies.   Social History:  The patient  reports that he has never smoked. He has never used smokeless tobacco. He reports that he drinks about 3.0 ounces of alcohol per week. He reports that he does not use illicit drugs.   Family History:  The patient's family history includes Alcohol abuse in his mother; Arthritis in his father; CAD (age of onset: 53) in his father; Cancer in his father; Heart attack in his father; Heart disease in his father; Hypertension in his mother.   ROS:  Please see the history of present illness.   He has been constipated.   All other systems reviewed and negative.   PHYSICAL EXAM: VS:  BP 134/102  Pulse 68  Ht 5' 9.5" (1.765 m)  Wt 233 lb (105.688 kg)  BMI 33.93 kg/m2  SpO2 97% Well nourished, well developed, in no acute distress HEENT: normal Neck: no JVD Cardiac:  normal S1, S2; RRR; no murmur Lungs:  clear to auscultation bilaterally, no wheezing, rhonchi or rales Abd: soft, nontender, no hepatomegaly Ext: no edema Skin: warm and dry Neuro:  CNs 2-12 intact, no focal abnormalities noted  EKG:  NSR, HR 68, normal axis     ASSESSMENT AND PLAN:  1. CAD:  He is overall stable. Continue aspirin and statin. Continue Effient. He will likely come off of Effient 1 year after his MI/PCI.   2. Hypertension:  Blood pressure increased recently. This is likely related to lack of exercise, increased  traveling and poor diet as well as increased stress. I have asked him to resume lisinopril 2.5 mg daily. He will have the nurse at his work continue to monitor his blood pressures. He will have a followup basic metabolic panel in 2 weeks. 3. Hyperlipidemia:  Continue statin. 4. Dyspepsia: I have suggested he obtain Pepcid or Zantac OTC and take as needed prior to meals that typically cause the symptoms. 5. Disposition:  Follow up with Dr. Verne Carrow in 06/2013.   Signed, Tereso Newcomer, PA-C  02/23/2013 10:42 AM

## 2013-02-23 NOTE — Patient Instructions (Addendum)
Try to get Zantac 150 mg or Pepcid 20 mg over the counter and take before meals that you know may cause indigestion.   LABS IN 2 WEEKS 03/09/13 FOR A BMET SINCE WE HAVE RE-STARTED LISINOPRIL   PLEASE HAVE THE NURSE AT YOUR WORK PLACE CHECK YOUR BLOOD PRESSURE DAILY; MAKE SURE TO KEEP A DIARY OF THE READINGS AND IF AFTER 2 WEEKS READINGS ARE STAYING ABOVE 140/90 PLEASE CALL [phone] Ken[address] Carmack, [facility] TO LET us KNOW  Your physician wants you to follow-up in: 4 MONTHS WITH DR. Clifton Lemarcus. You will receive a reminder letter in the mail two months in advance. If you don't receive a letter, please call our office to schedule the follow-up appointment.

## 2013-02-28 ENCOUNTER — Other Ambulatory Visit: Payer: Self-pay | Admitting: Cardiovascular Disease

## 2013-03-07 ENCOUNTER — Other Ambulatory Visit: Payer: Self-pay | Admitting: Cardiovascular Disease

## 2013-03-09 ENCOUNTER — Other Ambulatory Visit (INDEPENDENT_AMBULATORY_CARE_PROVIDER_SITE_OTHER): Payer: BC Managed Care – PPO

## 2013-03-09 ENCOUNTER — Encounter: Payer: Self-pay | Admitting: Physician Assistant

## 2013-03-09 DIAGNOSIS — I10 Essential (primary) hypertension: Secondary | ICD-10-CM

## 2013-03-09 LAB — BASIC METABOLIC PANEL
BUN: 14 mg/dL (ref 6–23)
CO2: 26 mEq/L (ref 19–32)
Chloride: 104 mEq/L (ref 96–112)
Creatinine, Ser: 1.1 mg/dL (ref 0.4–1.5)
GFR: 71.74 mL/min (ref >60.00)
Glucose, Bld: 90 mg/dL (ref 70–99)

## 2013-03-17 ENCOUNTER — Ambulatory Visit (INDEPENDENT_AMBULATORY_CARE_PROVIDER_SITE_OTHER): Payer: BC Managed Care – PPO | Admitting: Family Medicine

## 2013-03-17 ENCOUNTER — Encounter: Payer: Self-pay | Admitting: Family Medicine

## 2013-03-17 VITALS — BP 120/88 | Temp 98.6°F | Wt 230.0 lb

## 2013-03-17 DIAGNOSIS — E669 Obesity, unspecified: Secondary | ICD-10-CM

## 2013-03-17 DIAGNOSIS — I10 Essential (primary) hypertension: Secondary | ICD-10-CM

## 2013-03-17 DIAGNOSIS — D7589 Other specified diseases of blood and blood-forming organs: Secondary | ICD-10-CM

## 2013-03-17 DIAGNOSIS — E538 Deficiency of other specified B group vitamins: Secondary | ICD-10-CM

## 2013-03-17 MED ORDER — CYANOCOBALAMIN 1000 MCG/ML IJ SOLN
1000.0000 ug | Freq: Once | INTRAMUSCULAR | Status: AC
  Administered 2013-03-17: 1000 ug via INTRAMUSCULAR

## 2013-03-17 NOTE — Patient Instructions (Addendum)
Start oral B12 replacement at 1,000 mcg daily Continue with weight loss efforts.    Vitamin B12 Deficiency Not having enough vitamin B12 is called a deficiency. Vitamin B12 is an important vitamin. Your body needs vitamin B12 to:   Make red blood cells.  Make DNA. This is the genetic material inside all of your cells.  Help your nerves work properly so they can carry messages from your brain to your body. CAUSES  Not eating enough foods that contain vitamin B12.  Not having enough stomach acid and digestive juices. The body needs these to absorb vitamin B12 from the food you eat.  Having certain digestive system diseases that make it hard to absorb vitamin B12. These diseases include Crohn's disease, chronic pancreatitis, and cystic fibrosis.  Having pernicious anemia, which is a condition where the body has too few red blood cells. People with this condition do not make enough of a protein called "intrinsic factor," which is needed to absorb vitamin B12.  Having a surgery in which part of the stomach or small intestine is removed.  Taking certain medicines that make it hard for the body to absorb vitamin B12. These medicines include:  Heartburn medicine (antacids and proton pump inhibitors).  A certain antibiotic medicine called neomycin, which fights infection.  Some medicines used to treat diabetes, tuberculosis, gout, and high cholesterol. RISK FACTORS Risk factors are things that make you more likely to develop a vitamin B12 deficiency. They include:  Being older than 50.  Being a vegetarian.  Being pregnant and a vegetarian or having a poor diet.  Taking certain drugs.  Being an alcoholic. SYMPTOMS You may have a vitamin B12 deficiency with no symptoms. However, a vitamin B12 deficiency can cause health problems like anemia and nerve damage. These health problems can lead to many possible symptoms, including:  Weakness.  Fatigue.  Loss of appetite.  Weight  loss.  Numbness or tingling in your hands and feet.  Redness and burning of the tongue.  Confusion or memory problems.  Depression.  Dizziness.  Sensory problems, such as loss of taste, color blindness, and ringing in the ears.  Diarrhea or constipation.  Trouble walking. DIAGNOSIS Various types of tests can be given to help find the cause of your vitamin B12 deficiency. These tests include:  A complete blood count (CBC). This test gives your caregiver an overall picture of what makes up your blood.  A blood test to measure your B12 level.  A blood test to measure intrinsic factor.  An endoscopy. This procedure uses a thin tube with a camera on the end to look into your stomach or intestines. TREATMENT Treatment for vitamin B12 deficiency depends on what is causing it. Common options include:  Changing your eating and drinking habits, such as:  Eating more foods that contain vitamin B12.  Not drinking as much alcohol or any alcohol.  Taking vitamin B12 supplements. Your caregiver will tell you what dose is best for you.  Getting vitamin B12 injections. Some people get these a few times a week. Others get them once a month. HOME CARE INSTRUCTIONS  Take all supplements as directed by your caregiver. Follow the directions carefully.  Get any injections your caregiver prescribes. Do not miss your appointments.  Eat lots of healthy foods that contain vitamin B12. Ask your caregiver if you should work with a nutritionist. Good things to include in your diet are:  Meat.  Poultry.  Fish.  Eggs.  Fortified cereal and dairy  products. This means vitamin B12 has been added to the food. Check the label on the package to be sure.  Do not abuse alcohol.  Keep all follow-up appointments. Your caregiver will need to perform blood tests to make sure your vitamin B12 deficiency is going away. SEEK MEDICAL CARE IF:  You have any questions about your treatment.  Your  symptoms come back. MAKE SURE YOU:  Understand these instructions.  Will watch your condition.  Will get help right away if you are not doing well or get worse. Document Released: 07/02/2011 Document Reviewed: 07/02/2011 [facility] Patient Information 2014 [place], [place].

## 2013-03-17 NOTE — Progress Notes (Signed)
  Subjective:    Patient ID: Joshua King, male    DOB: 12/11/1960, 52 y.o.   MRN: [id number]  HPI Medical followup. Patient history of CAD, hypertension, dyslipidemia, obesity, obstructive sleep apnea. He had requested followup because of his weight issues but unfortunately has been traveling a lot and has had some poor dietary compliance. He has scaled back his alcohol somewhat but still drinking 2-3 glasses of red wine per day. His weight is down 1 pound from last visit. He recently saw cardiology office and was started on low-dose lisinopril 2.5 mg daily for elevated blood pressure. Blood pressures been stable by home readings. No recent chest pains.  Patient had macrocytosis with elevated MCV. We suspect related alcohol. Obtain B12 level which was 233. He's not taking any oral placements. He does not eat much red meat but is not a vegetarian and does eat dairy products. No PPI use. No hx of gastric surgery.  Past Medical History  Diagnosis Date  . GERD (gastroesophageal reflux disease)   . CAD (coronary artery disease), native coronary artery 05/2012    a. s/p NSTEMI 2/14: LHC 06/17/12: Mid LAD 90% and 80%, D2 99%, EF 55-60%. PCI: Promus Premier DES to the mid LAD and Promus Premier DES to the D2  . Hyperlipidemia   . Hypertension   . Sinus bradycardia   . Chicken pox    Past Surgical History  Procedure Laterality Date  . Appendectomy  1992  . Hernia repair  1999    inguinal  . Coronary angioplasty with stent placement  05/2012    90% then 80% mid LAD stenoses s/p DES x 1, 99% D2 lesion s/p DES x 1; LVEF 55-60%    reports that he has never smoked. He has never used smokeless tobacco. He reports that he drinks about 3.0 ounces of alcohol per week. He reports that he does not use illicit drugs. family history includes Alcohol abuse in his mother; Arthritis in his father; CAD (age of onset: 9) in his father; Cancer in his father; Heart attack in his father; Heart disease in his father;  Hypertension in his mother. No Known Allergies    Review of Systems  Constitutional: Positive for fatigue. Negative for appetite change and unexpected weight change.  Respiratory: Negative for shortness of breath.   Cardiovascular: Negative for chest pain.       Objective:   Physical Exam  Constitutional: He is oriented to person, place, and time. He appears well-developed and well-nourished.  HENT:  Right Ear: External ear normal.  Left Ear: External ear normal.  Mouth/Throat: Oropharynx is clear and moist.  Eyes: Pupils are equal, round, and reactive to light.  Neck: Neck supple. No thyromegaly present.  Cardiovascular: Normal rate and regular rhythm.   Pulmonary/Chest: Effort normal and breath sounds normal. No respiratory distress. He has no wheezes. He has no rales.  Musculoskeletal: He exhibits no edema.  Neurological: He is alert and oriented to person, place, and time.          Assessment & Plan:  #1 hypertension. Stable. Continue current regimen. Continue weight loss efforts. #2 obesity. Discussed weight loss strategies. Reassess 2 months.  #3 marginal low B12. Patient requesting injection. 1000 mcg given. We've recommended oral replacement with 1000 mcg daily we'll plan repeat B12 level in several months (4-6)

## 2013-03-17 NOTE — Progress Notes (Signed)
Pre visit review using our clinic review tool, if applicable. No additional management support is needed unless otherwise documented below in the visit note. 

## 2013-03-26 ENCOUNTER — Encounter: Payer: Self-pay | Admitting: Family Medicine

## 2013-03-27 ENCOUNTER — Telehealth: Payer: Self-pay

## 2013-03-27 NOTE — Telephone Encounter (Signed)
Pt would like to get his A1c checked. He just had one on 02-21-2013.

## 2013-03-28 ENCOUNTER — Other Ambulatory Visit: Payer: Self-pay | Admitting: Physician Assistant

## 2013-03-29 NOTE — Telephone Encounter (Signed)
Usually only get A1C  Every 3 months- takes that long to see much change with any intervention.  We can get one anytime but if he had early November, I would repeat early February.

## 2013-03-30 NOTE — Telephone Encounter (Signed)
Pt informed

## 2013-04-17 ENCOUNTER — Ambulatory Visit: Payer: BC Managed Care – PPO | Admitting: Family Medicine

## 2013-04-22 ENCOUNTER — Ambulatory Visit (INDEPENDENT_AMBULATORY_CARE_PROVIDER_SITE_OTHER): Payer: BC Managed Care – PPO | Admitting: Family Medicine

## 2013-04-22 ENCOUNTER — Encounter: Payer: Self-pay | Admitting: Family Medicine

## 2013-04-22 VITALS — BP 120/72 | HR 63 | Temp 98.3°F | Wt 233.0 lb

## 2013-04-22 DIAGNOSIS — E538 Deficiency of other specified B group vitamins: Secondary | ICD-10-CM

## 2013-04-22 DIAGNOSIS — I10 Essential (primary) hypertension: Secondary | ICD-10-CM

## 2013-04-22 MED ORDER — CYANOCOBALAMIN 1000 MCG/ML IJ SOLN
1000.0000 ug | Freq: Once | INTRAMUSCULAR | Status: AC
  Administered 2013-04-22: 1000 ug via INTRAMUSCULAR

## 2013-04-22 NOTE — Progress Notes (Signed)
Pre visit review using our clinic review tool, if applicable. No additional management support is needed unless otherwise documented below in the visit note. 

## 2013-04-22 NOTE — Progress Notes (Signed)
   Subjective:    Patient ID: Joshua King, male    DOB: November 10, 1960, 52 y.o.   MRN: [id number]  HPI Patient here for medical followup. History of CAD, hypertension, dyslipidemia, obesity. He also has history of borderline low B12. He is requesting repeat injection. He has started daily oral replacement with B12 1000 mcg daily. He has significantly scaled back his alcohol consumption and is down to about 4 glasses of wine per week. He is exercising more consistently. His blood pressures been very well controlled and he has left off his lisinopril the past couple of days. Overall feels better. He's not lost much weight but is he losing some body fat in the waist area. No chest pains.  Past Medical History  Diagnosis Date  . GERD (gastroesophageal reflux disease)   . CAD (coronary artery disease), native coronary artery 05/2012    a. s/p NSTEMI 2/14: LHC 06/17/12: Mid LAD 90% and 80%, D2 99%, EF 55-60%. PCI: Promus Premier DES to the mid LAD and Promus Premier DES to the D2  . Hyperlipidemia   . Hypertension   . Sinus bradycardia   . Chicken pox    Past Surgical History  Procedure Laterality Date  . Appendectomy  1992  . Hernia repair  1999    inguinal  . Coronary angioplasty with stent placement  05/2012    90% then 80% mid LAD stenoses s/p DES x 1, 99% D2 lesion s/p DES x 1; LVEF 55-60%    reports that he has never smoked. He has never used smokeless tobacco. He reports that he drinks about 3.0 ounces of alcohol per week. He reports that he does not use illicit drugs. family history includes Alcohol abuse in his mother; Arthritis in his father; CAD (age of onset: 68) in his father; Cancer in his father; Heart attack in his father; Heart disease in his father; Hypertension in his mother. No Known Allergies\     Review of Systems  Constitutional: Negative for fatigue.  Eyes: Negative for visual disturbance.  Respiratory: Negative for cough, chest tightness and shortness of breath.     Cardiovascular: Negative for chest pain, palpitations and leg swelling.  Endocrine: Negative for polydipsia and polyuria.  Neurological: Negative for dizziness, syncope, weakness, light-headedness and headaches.       Objective:   Physical Exam  Constitutional: He is oriented to person, place, and time. He appears well-developed and well-nourished.  HENT:  Right Ear: External ear normal.  Left Ear: External ear normal.  Mouth/Throat: Oropharynx is clear and moist.  Eyes: Pupils are equal, round, and reactive to light.  Neck: Neck supple. No thyromegaly present.  Cardiovascular: Normal rate and regular rhythm.   Pulmonary/Chest: Effort normal and breath sounds normal. No respiratory distress. He has no wheezes. He has no rales.  Musculoskeletal: He exhibits no edema.  Neurological: He is alert and oriented to person, place, and time.          Assessment & Plan:  #1 hypertension. Stable. Gave option of leaving off lisinopril. Suspect this is improved secondary to reduction in alcohol consumption. #2 B12 deficiency. B12 1000 mcg IM is given today at his request. Continue daily oral replacement. Recheck B12 level followup

## 2013-04-22 NOTE — Addendum Note (Signed)
Addended by: Shelby Dubin E on: 04/22/2013 09:00 AM   Modules accepted: Orders

## 2013-06-05 ENCOUNTER — Encounter: Payer: Self-pay | Admitting: Family Medicine

## 2013-06-05 ENCOUNTER — Ambulatory Visit (INDEPENDENT_AMBULATORY_CARE_PROVIDER_SITE_OTHER): Payer: BC Managed Care – PPO | Admitting: Family Medicine

## 2013-06-05 VITALS — BP 120/72 | HR 60 | Temp 98.4°F | Wt 228.0 lb

## 2013-06-05 DIAGNOSIS — K219 Gastro-esophageal reflux disease without esophagitis: Secondary | ICD-10-CM

## 2013-06-05 DIAGNOSIS — I10 Essential (primary) hypertension: Secondary | ICD-10-CM

## 2013-06-05 DIAGNOSIS — E538 Deficiency of other specified B group vitamins: Secondary | ICD-10-CM

## 2013-06-05 DIAGNOSIS — G4733 Obstructive sleep apnea (adult) (pediatric): Secondary | ICD-10-CM

## 2013-06-05 LAB — VITAMIN B12: Vitamin B-12: 447 pg/mL (ref 211–911)

## 2013-06-05 MED ORDER — OMEPRAZOLE 20 MG PO CPDR: 20.0000 mg | DELAYED_RELEASE_CAPSULE | Freq: Every day | ORAL | Status: DC

## 2013-06-05 NOTE — Progress Notes (Signed)
Pre visit review using our clinic review tool, if applicable. No additional management support is needed unless otherwise documented below in the visit note. 

## 2013-06-05 NOTE — Progress Notes (Signed)
   Subjective:    Patient ID: Joshua King, male    DOB: Apr 22, 1961, 53 y.o.   MRN: [id number]  HPI Patient seen for several issues as follows: He has hypertension and currently off medications. He has lost 5 more pounds and feels much better. He is exercising more consistently. Blood pressures been stable. No headaches. Still excessive wine intake at times.  He's had some atypical GERD symptoms the past and had some recently possibly triggered by caffeine consumption. Previously had been placed on omeprazole per gastroenterology and requesting refills. Denies any chronic cough. No dysphagia.  B12 deficiency which has been borderline. He is currently taking oral replacement. Requesting repeat levels today. Most recent level 233.  Past Medical History  Diagnosis Date  . GERD (gastroesophageal reflux disease)   . CAD (coronary artery disease), native coronary artery 05/2012    a. s/p NSTEMI 2/14: LHC 06/17/12: Mid LAD 90% and 80%, D2 99%, EF 55-60%. PCI: Promus Premier DES to the mid LAD and Promus Premier DES to the D2  . Hyperlipidemia   . Hypertension   . Sinus bradycardia   . Chicken pox    Past Surgical History  Procedure Laterality Date  . Appendectomy  1992  . Hernia repair  1999    inguinal  . Coronary angioplasty with stent placement  05/2012    90% then 80% mid LAD stenoses s/p DES x 1, 99% D2 lesion s/p DES x 1; LVEF 55-60%    reports that he has never smoked. He has never used smokeless tobacco. He reports that he drinks about 3.0 ounces of alcohol per week. He reports that he does not use illicit drugs. family history includes Alcohol abuse in his mother; Arthritis in his father; CAD (age of onset: 5180) in his father; Cancer in his father; Heart attack in his father; Heart disease in his father; Hypertension in his mother. No Known Allergies    Review of Systems  Constitutional: Negative for fatigue.  Eyes: Negative for visual disturbance.  Respiratory: Negative for  cough, chest tightness and shortness of breath.   Cardiovascular: Negative for chest pain, palpitations and leg swelling.  Neurological: Negative for dizziness, syncope, weakness, light-headedness and headaches.       Objective:   Physical Exam  Constitutional: He appears well-developed and well-nourished.  Neck: Neck supple. No thyromegaly present.  Cardiovascular: Normal rate and regular rhythm.   Pulmonary/Chest: Effort normal and breath sounds normal. No respiratory distress. He has no wheezes. He has no rales.  Musculoskeletal: He exhibits no edema.          Assessment & Plan:  #1 B12 deficiency. Recheck B12 levels. Continue oral supplementation #2 history of GERD. Refill omeprazole 20 mg once daily. GERD dietary changes and lifestyle modifications discussed #3 hypertension history- currently stable with weight loss off medication

## 2013-06-05 NOTE — Patient Instructions (Signed)
Gastroesophageal Reflux Disease, Adult  Gastroesophageal reflux disease (GERD) happens when acid from your stomach flows up into the esophagus. When acid comes in contact with the esophagus, the acid causes soreness (inflammation) in the esophagus. Over time, GERD may create small holes (ulcers) in the lining of the esophagus.  CAUSES   · Increased body weight. This puts pressure on the stomach, making acid rise from the stomach into the esophagus.  · Smoking. This increases acid production in the stomach.  · Drinking alcohol. This causes decreased pressure in the lower esophageal sphincter (valve or ring of muscle between the esophagus and stomach), allowing acid from the stomach into the esophagus.  · Late evening meals and a full stomach. This increases pressure and acid production in the stomach.  · A malformed lower esophageal sphincter.  Sometimes, no cause is found.  SYMPTOMS   · Burning pain in the lower part of the mid-chest behind the breastbone and in the mid-stomach area. This may occur twice a week or more often.  · Trouble swallowing.  · Sore throat.  · Dry cough.  · Asthma-like symptoms including chest tightness, shortness of breath, or wheezing.  DIAGNOSIS   Your caregiver may be able to diagnose GERD based on your symptoms. In some cases, X-rays and other tests may be done to check for complications or to check the condition of your stomach and esophagus.  TREATMENT   Your caregiver may recommend over-the-counter or prescription medicines to help decrease acid production. Ask your caregiver before starting or adding any new medicines.   HOME CARE INSTRUCTIONS   · Change the factors that you can control. Ask your caregiver for guidance concerning weight loss, quitting smoking, and alcohol consumption.  · Avoid foods and drinks that make your symptoms worse, such as:  · Caffeine or alcoholic drinks.  · Chocolate.  · Peppermint or mint flavorings.  · Garlic and onions.  · Spicy foods.  · Citrus fruits,  such as oranges, lemons, or limes.  · Tomato-based foods such as sauce, chili, salsa, and pizza.  · Fried and fatty foods.  · Avoid lying down for the 3 hours prior to your bedtime or prior to taking a nap.  · Eat small, frequent meals instead of large meals.  · Wear loose-fitting clothing. Do not wear anything tight around your waist that causes pressure on your stomach.  · Raise the head of your bed 6 to 8 inches with wood blocks to help you sleep. Extra pillows will not help.  · Only take over-the-counter or prescription medicines for pain, discomfort, or fever as directed by your caregiver.  · Do not take aspirin, ibuprofen, or other nonsteroidal anti-inflammatory drugs (NSAIDs).  SEEK IMMEDIATE MEDICAL CARE IF:   · You have pain in your arms, neck, jaw, teeth, or back.  · Your pain increases or changes in intensity or duration.  · You develop nausea, vomiting, or sweating (diaphoresis).  · You develop shortness of breath, or you faint.  · Your vomit is green, yellow, black, or looks like coffee grounds or blood.  · Your stool is red, bloody, or black.  These symptoms could be signs of other problems, such as heart disease, gastric bleeding, or esophageal bleeding.  MAKE SURE YOU:   · Understand these instructions.  · Will watch your condition.  · Will get help right away if you are not doing well or get worse.  Document Released: 01/17/2005 Document Revised: 07/02/2011 Document Reviewed: 10/27/2010  [employer]® Patient   Information ©2014 [employer].

## 2013-06-08 ENCOUNTER — Telehealth: Payer: Self-pay | Admitting: Family Medicine

## 2013-06-08 NOTE — Telephone Encounter (Signed)
Relevant patient education assigned to patient using Emmi. ° °

## 2013-07-17 ENCOUNTER — Ambulatory Visit: Payer: BC Managed Care – PPO | Admitting: Family Medicine

## 2013-07-17 DIAGNOSIS — Z0289 Encounter for other administrative examinations: Secondary | ICD-10-CM

## 2013-07-27 ENCOUNTER — Encounter: Payer: Self-pay | Admitting: Cardiovascular Disease

## 2013-08-07 ENCOUNTER — Encounter: Payer: Self-pay | Admitting: Family Medicine

## 2013-08-07 ENCOUNTER — Ambulatory Visit (INDEPENDENT_AMBULATORY_CARE_PROVIDER_SITE_OTHER): Payer: BC Managed Care – PPO | Admitting: Family Medicine

## 2013-08-07 VITALS — BP 128/74 | HR 64 | Temp 98.3°F | Wt 235.0 lb

## 2013-08-07 DIAGNOSIS — E785 Hyperlipidemia, unspecified: Secondary | ICD-10-CM

## 2013-08-07 DIAGNOSIS — E538 Deficiency of other specified B group vitamins: Secondary | ICD-10-CM

## 2013-08-07 DIAGNOSIS — R208 Other disturbances of skin sensation: Secondary | ICD-10-CM

## 2013-08-07 DIAGNOSIS — K219 Gastro-esophageal reflux disease without esophagitis: Secondary | ICD-10-CM

## 2013-08-07 DIAGNOSIS — R209 Unspecified disturbances of skin sensation: Secondary | ICD-10-CM

## 2013-08-07 DIAGNOSIS — I10 Essential (primary) hypertension: Secondary | ICD-10-CM

## 2013-08-07 NOTE — Progress Notes (Signed)
Pre visit review using our clinic review tool, if applicable. No additional management support is needed unless otherwise documented below in the visit note. 

## 2013-08-07 NOTE — Progress Notes (Signed)
Subjective:    Patient ID: Joshua King, male    DOB: 04-20-1961, 53 y.o.   MRN: [id number]  HPI Comments: 53 year-old male presents for HTN follow-up, weight loss accountability, and intermittent left quad discomfort.  HTN Patient does not take blood pressure at home regularly, but will occasionally check it at his office clinic. It is monitored twice every six weeks and typically is 124/72ish even at very high stress times. Patient has not had chest pain but does mention some chest wall tenderness. The tenderness is localized to the left chest wall, does not radiate, can be reproduced with movement and occurs after he lifts weight. He says he knows the difference since he has had cardiac pain in the past. He has never felt short of breath and is compliant with all medications except Lipitor. He complains of muscle pain and joint pain when he takes this medication so he does not take it every day. Patient denies fever, chills, lightheadedness, dizziness, and cough.  Overweight Patient is attempting to reach a healthy weight for his height. He exercises by weight lifting and walking;  Sometimes he adheres to a strict regimen but now his routine is more sporadic. He travels a lot for work and tries to eat cleaner then. He states he has a healthy appetite and has not been eating more or less than normal. Patient states the scale at this office is 7 pounds higher than his weight yesterday using a scale that was just calibrated at his work office. He watches his sodium and has not noticed any edema or swelling in extremities. He drinks 2 glasses a wine a day 5 days a week and has never smoked. Patient denies change in appetite, nausea, vomiting, diarrhea, and constipation.   Quadricep discomfort Intermittent numbness or tingling in left quad lasting for about 30 minutes at a time and occurs for 7-10 consecutive days. These episodes happen a couple times a year. There is no pain associated. Sensation  described as mild electric pulsation. Does not limit his daily activities. [place] in 2013, [place] and attributes symptom to that. He hasn't tried anything and hasn't seen anyone for treatment. Last time this sensation occurred was a few weeks ago. Is not experiencing symptoms at this time. Patient denies dizziness, loss of coordination, and decreased strength.     Past Medical History  Diagnosis Date  . GERD (gastroesophageal reflux disease)   . CAD (coronary artery disease), native coronary artery 05/2012    a. s/p NSTEMI 2/14: LHC 06/17/12: Mid LAD 90% and 80%, D2 99%, EF 55-60%. PCI: Promus Premier DES to the mid LAD and Promus Premier DES to the D2  . Hyperlipidemia   . Hypertension   . Sinus bradycardia   . Chicken pox     Past Surgical History  Procedure Laterality Date  . Appendectomy  1992  . Hernia repair  1999    inguinal  . Coronary angioplasty with stent placement  05/2012    90% then 80% mid LAD stenoses s/p DES x 1, 99% D2 lesion s/p DES x 1; LVEF 55-60%     Review of Systems  Constitutional: Negative for fever, chills and activity change.  HENT: Negative for congestion.   Respiratory: Negative for cough and shortness of breath.   Cardiovascular: Negative for chest pain and palpitations.  Gastrointestinal: Negative for nausea, abdominal pain, diarrhea and constipation.  Musculoskeletal: Negative for arthralgias and myalgias.       Thinks joint aches,  muscle aches and fatigue during exercise is due to the Lipitor.   Neurological: Negative for dizziness and syncope.       Objective:   Physical Exam  Constitutional: He appears well-developed and well-nourished.  Rechecked blood pressure 122/82 right arm, sitting.   HENT:  Nose: Nose normal. No rhinorrhea.  Mouth/Throat: No posterior oropharyngeal edema or posterior oropharyngeal erythema.  Eyes: Conjunctivae are normal. Pupils are equal, round, and reactive to light.  Neck: Normal range of motion. No  muscular tenderness present. No mass and no thyromegaly present.  Cardiovascular: Normal rate, normal heart sounds and normal pulses.   Pulses:      Radial pulses are 2+ on the right side, and 2+ on the left side.       Posterior tibial pulses are 2+ on the right side, and 2+ on the left side.  Pulmonary/Chest: Effort normal and breath sounds normal.  Abdominal: Soft. There is no tenderness.  Neurological: He is alert. He has normal strength. Gait normal.  Reflex Scores:      Bicep reflexes are 2+ on the right side and 2+ on the left side.      Patellar reflexes are 2+ on the right side and 2+ on the left side. Left Quadricep: no tenderness, good sensation, 5/5 strength.   Skin: Skin is warm and dry.  Psychiatric: He has a normal mood and affect.          Assessment & Plan:  1. Hypertension Patient wants to continue Lipitor until next 6 week follow-up and then consider switching to another statin to prevent the muscle aches he is experiencing. Will have lipid levels checked by cardiologist before next appointment. Educational handout given.  2. Left Quadricep Dysethesia Suspect peripheral nerve involvement and given transient nature, will not work up at this time.  3. Overweight Encouraged diet and exercise, and limit alcohol intake.  4. Chest wall tenderness No concern for cardiac origin given it occurs after lifting weights, does not radiate, and patient does not experience nausea, vomiting, shortness of breath, or diaphoresis. 5. History of B12 Deficiency Labs reviewed, results within normal range, no further action at this time. 6. History of GERD  Stable on omeprazole.  GrenadaBrittany Kadden Osterhout, PA-S  As above.  Agree with assessment and plan. Evelena PeatBruce Burchette, MD

## 2013-08-07 NOTE — Patient Instructions (Signed)

## 2013-08-18 ENCOUNTER — Telehealth: Payer: Self-pay | Admitting: Family Medicine

## 2013-08-18 ENCOUNTER — Encounter: Payer: Self-pay | Admitting: Cardiovascular Disease

## 2013-08-18 ENCOUNTER — Ambulatory Visit (INDEPENDENT_AMBULATORY_CARE_PROVIDER_SITE_OTHER): Payer: BC Managed Care – PPO | Admitting: Cardiovascular Disease

## 2013-08-18 VITALS — BP 128/80 | HR 80 | Ht 69.5 in | Wt 228.0 lb

## 2013-08-18 DIAGNOSIS — I10 Essential (primary) hypertension: Secondary | ICD-10-CM

## 2013-08-18 DIAGNOSIS — E785 Hyperlipidemia, unspecified: Secondary | ICD-10-CM

## 2013-08-18 DIAGNOSIS — I2510 Atherosclerotic heart disease of native coronary artery without angina pectoris: Secondary | ICD-10-CM

## 2013-08-18 MED ORDER — CLOPIDOGREL BISULFATE 75 MG PO TABS: 75.0000 mg | ORAL_TABLET | Freq: Every day | ORAL | Status: DC

## 2013-08-18 NOTE — Patient Instructions (Addendum)
Your physician wants you to follow-up in: 6 months.    You will receive a reminder letter in the mail two months in advance. If you don't receive a letter, please call our office to schedule the follow-up appointment.  Your physician recommends that you return for fasting lab work in: May.. The lab opens at 7:30 AM every week day.    Your physician has recommended you make the following change in your medication:  Stop Effient. Start Clopidogrel 75 mg by mouth daily.  Stop Prilosec.

## 2013-08-18 NOTE — Telephone Encounter (Signed)
Dr. Toni ArthursFuller  (dentist) is calling in regards to getting the pt a oral appliance for sleep apnea, for traveling purposes only.  Dr. Toni ArthursFuller states she prefer to inform the primary pcp before provided the appliance wants to know if its ok.

## 2013-08-18 NOTE — Progress Notes (Addendum)
History of Present Illness: 53 yo male with history of CAD, HTN, HLD, GERD here today for cardiac follow up. [facility] with a NSTEMI. LHC 06/17/12: mid LAD 90% and 80%, D2 99%, EF 55-60%. PCI: Promus Premier DES to the mid LAD and Promus Premier DES to the D2. He has not been treated with a beta blocker due to bradycardia. His Lisinopril was stopped March 2014 at visit with Tereso NewcomerScott Weaver, PA-C due to dizziness and hypotension. Exercise stress test May 2014 without ischemia.   He is here today for follow up.  He has been feeling well. No chest pain or SOB. He has been exercising.   Primary Care Physician: Evelena PeatBruce King  Last Lipid Profile:Lipid Panel     Component Value Date/Time   CHOL 152 09/04/2012 1037   TRIG 52.0 09/04/2012 1037   HDL 58.00 09/04/2012 1037   CHOLHDL 3 09/04/2012 1037   VLDL 10.4 09/04/2012 1037   LDLCALC 84 09/04/2012 1037     Past Medical History  Diagnosis Date  . GERD (gastroesophageal reflux disease)   . CAD (coronary artery disease), native coronary artery 05/2012    a. s/p NSTEMI 2/14: LHC 06/17/12: Mid LAD 90% and 80%, D2 99%, EF 55-60%. PCI: Promus Premier DES to the mid LAD and Promus Premier DES to the D2  . Hyperlipidemia   . Hypertension   . Sinus bradycardia   . Chicken pox     Past Surgical History  Procedure Laterality Date  . Appendectomy  1992  . Hernia repair  1999    inguinal  . Coronary angioplasty with stent placement  05/2012    90% then 80% mid LAD stenoses s/p DES x 1, 99% D2 lesion s/p DES x 1; LVEF 55-60%    Current Outpatient Prescriptions  Medication Sig Dispense Refill  . aspirin EC 81 MG tablet Take 1 tablet (81 mg total) by mouth once.      Marland Kitchen. atorvastatin (LIPITOR) 40 MG tablet TAKE 1 TABLET (40 MG TOTAL) BY MOUTH DAILY AT 6 PM.  90 tablet  1  . buPROPion (WELLBUTRIN XL) 150 MG 24 hr tablet Take 1 tablet by mouth daily.      Marland Kitchen. EFFIENT 10 MG TABS tablet TAKE 1 TABLET (10 MG TOTAL) BY MOUTH DAILY.  90  tablet  1  . nitroGLYCERIN (NITROSTAT) 0.4 MG SL tablet Place 1 tablet (0.4 mg total) under the tongue every 5 (five) minutes x 3 doses as needed for chest pain.  25 tablet  3  . omeprazole (PRILOSEC) 20 MG capsule Take 1 capsule (20 mg total) by mouth daily.  30 capsule  3   No current facility-administered medications for this visit.    No Known Allergies  History   Social History  . Marital Status: Married    Spouse Name: N/A    Number of Children: 2  . Years of Education: N/A   Occupational History  .     Social History Main Topics  . Smoking status: Never Smoker   . Smokeless tobacco: Never Used  . Alcohol Use: 3.0 - 4.0 oz/week    6-8 drink(s) per week  . Drug Use: No  . Sexual Activity: Not on file   Other Topics Concern  . Not on file   Social History Narrative  . No narrative on file    Family History  Problem Relation Age of Onset  . CAD Father 9980  . Heart attack Father   .  Arthritis Father   . Cancer Father     prostate  . Heart disease Father   . Alcohol abuse Mother   . Hypertension Mother     Review of Systems:  As stated in the HPI and otherwise negative.   BP 128/80  Pulse 80  Ht 5' 9.5" (1.765 m)  Wt 228 lb (103.42 kg)  BMI 33.20 kg/m2  Physical Examination: General: Well developed, well nourished, NAD HEENT: OP clear, mucus membranes moist SKIN: warm, dry. No rashes. Neuro: No focal deficits Musculoskeletal: Muscle strength 5/5 all ext Psychiatric: Mood and affect normal Neck: No JVD, no carotid bruits, no thyromegaly, no lymphadenopathy. Lungs:Clear bilaterally, no wheezes, rhonci, crackles Cardiovascular: Regular rate and rhythm. No murmurs, gallops or rubs. Abdomen:Soft. Bowel sounds present. Non-tender.  Extremities: No lower extremity edema. Pulses are 2 + in the bilateral DP/PT.  Assessment and Plan:   1. CAD: He is stable. Stress test May 2014 without ischemia. Continue dual anti-platelet therapy. Will stop Effient and  will start Plavix 75mg  daily. Based on results of DAPT trial, he will benefit from long term dual anti-platelet therapy. Continue statin. He does not tolerate beta blockers due to bradycardia and does not tolerate Ace-inh due to hypotension.   2. HTN: BP well controlled. No changes.   3. Hyperlipidemia: Lipids well controlled. Last check May 2014. Will check lipids and LFTs in 3 weeks. Continue statin.

## 2013-08-19 NOTE — Telephone Encounter (Signed)
OK 

## 2013-08-19 NOTE — Telephone Encounter (Signed)
Office is going to fax over a letter.

## 2013-08-27 ENCOUNTER — Other Ambulatory Visit: Payer: BC Managed Care – PPO

## 2013-09-03 ENCOUNTER — Encounter: Payer: Self-pay | Admitting: Cardiovascular Disease

## 2013-09-09 ENCOUNTER — Encounter: Payer: Self-pay | Admitting: Family Medicine

## 2013-09-09 ENCOUNTER — Encounter: Payer: Self-pay | Admitting: Cardiovascular Disease

## 2013-09-15 ENCOUNTER — Other Ambulatory Visit: Payer: Self-pay | Admitting: *Deleted

## 2013-09-15 MED ORDER — ATORVASTATIN CALCIUM 80 MG PO TABS: 80.0000 mg | ORAL_TABLET | Freq: Every day | ORAL | Status: DC

## 2013-09-21 ENCOUNTER — Telehealth: Payer: Self-pay | Admitting: *Deleted

## 2013-09-21 DIAGNOSIS — E785 Hyperlipidemia, unspecified: Secondary | ICD-10-CM

## 2013-09-21 NOTE — Telephone Encounter (Signed)
I placed call to pt regarding recent lab work faxed to our office. Left message to call back

## 2013-10-07 ENCOUNTER — Ambulatory Visit: Payer: BC Managed Care – PPO | Admitting: Family Medicine

## 2013-10-12 ENCOUNTER — Encounter: Payer: Self-pay | Admitting: Family Medicine

## 2013-10-12 ENCOUNTER — Ambulatory Visit (INDEPENDENT_AMBULATORY_CARE_PROVIDER_SITE_OTHER): Payer: BC Managed Care – PPO | Admitting: Family Medicine

## 2013-10-12 VITALS — BP 130/80 | HR 60 | Temp 98.4°F | Wt 228.0 lb

## 2013-10-12 DIAGNOSIS — E785 Hyperlipidemia, unspecified: Secondary | ICD-10-CM

## 2013-10-12 DIAGNOSIS — E669 Obesity, unspecified: Secondary | ICD-10-CM

## 2013-10-12 NOTE — Progress Notes (Signed)
   Subjective:    Patient ID: Joshua King, male    DOB: October 21, 1960, 53 y.o.   MRN: [id number]  HPI Patient has history of obesity, CAD, GERD, hyperlipidemia, hypertension. He is here today for routine medical followup. He has requested every two-month followup is in process of trying to lose some weight. Lost 7 pounds since last visit here. He is exercising fairly frequently. No chest pains. No dizziness. Cardiologist recently increased his Lipitor to 80 mg. He had an NMR profile with large number of small dense LDL particles. He is on regular antiplatelet therapy. Blood pressures been stable. No history of diabetes. Recent A1c 5.7%. He has gold getting his weight down to 200 pounds. Nonsmoker  Reviewed with no changes:  Past Medical History  Diagnosis Date  . GERD (gastroesophageal reflux disease)   . CAD (coronary artery disease), native coronary artery 05/2012    a. s/p NSTEMI 2/14: LHC 06/17/12: Mid LAD 90% and 80%, D2 99%, EF 55-60%. PCI: Promus Premier DES to the mid LAD and Promus Premier DES to the D2  . Hyperlipidemia   . Hypertension   . Sinus bradycardia   . Chicken pox    Past Surgical History  Procedure Laterality Date  . Appendectomy  1992  . Hernia repair  1999    inguinal  . Coronary angioplasty with stent placement  05/2012    90% then 80% mid LAD stenoses s/p DES x 1, 99% D2 lesion s/p DES x 1; LVEF 55-60%    reports that he has never smoked. He has never used smokeless tobacco. He reports that he drinks about 3 - 4 ounces of alcohol per week. He reports that he does not use illicit drugs. family history includes Alcohol abuse in his mother; Arthritis in his father; CAD (age of onset: 6980) in his father; Cancer in his father; Heart attack in his father; Heart disease in his father; Hypertension in his mother. No Known Allergies    Review of Systems  Constitutional: Negative for fatigue and unexpected weight change.  Eyes: Negative for visual disturbance.    Respiratory: Negative for cough, chest tightness and shortness of breath.   Cardiovascular: Negative for chest pain, palpitations and leg swelling.  Endocrine: Negative for polydipsia and polyuria.  Neurological: Negative for dizziness, syncope, weakness, light-headedness and headaches.       Objective:   Physical Exam  Constitutional: He is oriented to person, place, and time. He appears well-developed and well-nourished.  HENT:  Right Ear: External ear normal.  Left Ear: External ear normal.  Mouth/Throat: Oropharynx is clear and moist.  Eyes: Pupils are equal, round, and reactive to light.  Neck: Neck supple. No thyromegaly present.  Cardiovascular: Normal rate and regular rhythm.   Pulmonary/Chest: Effort normal and breath sounds normal. No respiratory distress. He has no wheezes. He has no rales.  Musculoskeletal: He exhibits no edema.  Neurological: He is alert and oriented to person, place, and time.          Assessment & Plan:  #1 obesity. He is making progress with weight loss is encouraged to continue #2 dyslipidemia. Recent lipids were reviewed with patient. He is tolerated Lipitor 80 mg he'll get followup within the next month or so regarding that

## 2013-10-12 NOTE — Telephone Encounter (Signed)
Recent labs reviewed with pt. He has increased Atorvastatin to 80 mg as directed by Dr. Clifton JamesMcAlhany on May 26,2015. He will come in for fasting NMR with lipids and liver profile the week of August 17.

## 2013-10-12 NOTE — Progress Notes (Signed)
Pre visit review using our clinic review tool, if applicable. No additional management support is needed unless otherwise documented below in the visit note. 

## 2013-10-26 ENCOUNTER — Other Ambulatory Visit: Payer: Self-pay | Admitting: Cardiovascular Disease

## 2013-10-30 ENCOUNTER — Other Ambulatory Visit: Payer: Self-pay | Admitting: Cardiovascular Disease

## 2013-10-30 ENCOUNTER — Other Ambulatory Visit: Payer: Self-pay | Admitting: *Deleted

## 2013-10-30 MED ORDER — CLOPIDOGREL BISULFATE 75 MG PO TABS: 75.0000 mg | ORAL_TABLET | Freq: Every day | ORAL | Status: DC

## 2013-12-04 ENCOUNTER — Ambulatory Visit (INDEPENDENT_AMBULATORY_CARE_PROVIDER_SITE_OTHER): Payer: BC Managed Care – PPO | Admitting: Family Medicine

## 2013-12-04 VITALS — BP 124/78 | HR 75 | Wt 231.0 lb

## 2013-12-04 DIAGNOSIS — I10 Essential (primary) hypertension: Secondary | ICD-10-CM

## 2013-12-04 DIAGNOSIS — E785 Hyperlipidemia, unspecified: Secondary | ICD-10-CM

## 2013-12-04 NOTE — Progress Notes (Signed)
   Subjective:    Patient ID: Joshua King, male    DOB: 08/20/1960, 53 y.o.   MRN: [id number]  HPI Followup regarding obesity, hypertension, dyslipidemia. He remains on Lipitor 80 mg daily. Exercising more diligently though his weight has not gone down further. He does feel his body fat has diminished. Overall, he feels great. No chest pains. He has repeat lipid panel per cardiology next week  Past Medical History  Diagnosis Date  . GERD (gastroesophageal reflux disease)   . CAD (coronary artery disease), native coronary artery 05/2012    a. s/p NSTEMI 2/14: LHC 06/17/12: Mid LAD 90% and 80%, D2 99%, EF 55-60%. PCI: Promus Premier DES to the mid LAD and Promus Premier DES to the D2  . Hyperlipidemia   . Hypertension   . Sinus bradycardia   . Chicken pox    Past Surgical History  Procedure Laterality Date  . Appendectomy  1992  . Hernia repair  1999    inguinal  . Coronary angioplasty with stent placement  05/2012    90% then 80% mid LAD stenoses s/p DES x 1, 99% D2 lesion s/p DES x 1; LVEF 55-60%    reports that he has never smoked. He has never used smokeless tobacco. He reports that he drinks about 3 - 4 ounces of alcohol per week. He reports that he does not use illicit drugs. family history includes Alcohol abuse in his mother; Arthritis in his father; CAD (age of onset: 4880) in his father; Cancer in his father; Heart attack in his father; Heart disease in his father; Hypertension in his mother. No Known Allergies    Review of Systems  Constitutional: Negative for fatigue.  Eyes: Negative for visual disturbance.  Respiratory: Negative for cough, chest tightness and shortness of breath.   Cardiovascular: Negative for chest pain, palpitations and leg swelling.  Neurological: Negative for dizziness, syncope, weakness, light-headedness and headaches.       Objective:   Physical Exam  Constitutional: He is oriented to person, place, and time. He appears well-developed and  well-nourished.  HENT:  Right Ear: External ear normal.  Left Ear: External ear normal.  Mouth/Throat: Oropharynx is clear and moist.  Eyes: Pupils are equal, round, and reactive to light.  Neck: Neck supple. No thyromegaly present.  Cardiovascular: Normal rate and regular rhythm.   Pulmonary/Chest: Effort normal and breath sounds normal. No respiratory distress. He has no wheezes. He has no rales.  Musculoskeletal: He exhibits no edema.  Neurological: He is alert and oriented to person, place, and time.          Assessment & Plan:  #1 dyslipidemia. Scheduled lipids through cardiology next week. Continue Lipitor 80 mg daily #2 hypertension which is stable and at goal #3 history of prediabetes. Recheck A1c at followup in 3 months

## 2013-12-04 NOTE — Progress Notes (Signed)
Pre visit review using our clinic review tool, if applicable. No additional management support is needed unless otherwise documented below in the visit note. 

## 2013-12-07 ENCOUNTER — Other Ambulatory Visit (INDEPENDENT_AMBULATORY_CARE_PROVIDER_SITE_OTHER): Payer: BC Managed Care – PPO

## 2013-12-07 DIAGNOSIS — E785 Hyperlipidemia, unspecified: Secondary | ICD-10-CM

## 2013-12-07 LAB — HEPATIC FUNCTION PANEL
ALBUMIN: 4.2 g/dL (ref 3.5–5.2)
ALT: 47 U/L (ref 0–53)
AST: 33 U/L (ref 0–37)
Alkaline Phosphatase: 33 U/L — ABNORMAL LOW (ref 39–117)
Bilirubin, Direct: 0 mg/dL (ref 0.0–0.3)
Total Bilirubin: 1.2 mg/dL (ref 0.2–1.2)
Total Protein: 6.8 g/dL (ref 6.0–8.3)

## 2013-12-08 LAB — NMR LIPOPROFILE WITH LIPIDS
Cholesterol, Total: 165 mg/dL (ref <200)
HDL PARTICLE NUMBER: 38.5 umol/L (ref >=30.5)
HDL Size: 9 nm — ABNORMAL LOW (ref >=9.2)
HDL-C: 60 mg/dL (ref >=40)
LARGE HDL: 7.7 umol/L (ref >=4.8)
LDL (calc): 91 mg/dL (ref <100)
LDL Particle Number: 1507 nmol/L — ABNORMAL HIGH (ref <1000)
LDL Size: 20.8 nm (ref >20.5)
LP-IR Score: 25 (ref <=45)
Large VLDL-P: <0.8 nmol/L (ref <=2.7)
SMALL LDL PARTICLE NUMBER: 749 nmol/L — AB (ref <=527)
Triglycerides: 70 mg/dL (ref <150)

## 2014-01-06 ENCOUNTER — Encounter: Payer: Self-pay | Admitting: Cardiovascular Disease

## 2014-01-07 ENCOUNTER — Other Ambulatory Visit: Payer: Self-pay | Admitting: *Deleted

## 2014-01-07 DIAGNOSIS — E785 Hyperlipidemia, unspecified: Secondary | ICD-10-CM

## 2014-01-07 MED ORDER — EZETIMIBE 10 MG PO TABS
10.0000 mg | ORAL_TABLET | Freq: Every day | ORAL | Status: DC
Start: 2014-01-07 — End: 2014-12-30

## 2014-01-24 ENCOUNTER — Other Ambulatory Visit: Payer: Self-pay | Admitting: Cardiovascular Disease

## 2014-02-18 ENCOUNTER — Encounter: Payer: Self-pay | Admitting: Cardiovascular Disease

## 2014-02-21 ENCOUNTER — Other Ambulatory Visit: Payer: Self-pay | Admitting: Cardiovascular Disease

## 2014-03-08 ENCOUNTER — Encounter: Payer: Self-pay | Admitting: Family Medicine

## 2014-03-08 ENCOUNTER — Ambulatory Visit (INDEPENDENT_AMBULATORY_CARE_PROVIDER_SITE_OTHER): Payer: BC Managed Care – PPO | Admitting: Family Medicine

## 2014-03-08 VITALS — BP 120/70 | HR 70 | Wt 233.0 lb

## 2014-03-08 DIAGNOSIS — G5712 Meralgia paresthetica, left lower limb: Secondary | ICD-10-CM | POA: Insufficient documentation

## 2014-03-08 DIAGNOSIS — J069 Acute upper respiratory infection, unspecified: Secondary | ICD-10-CM

## 2014-03-08 DIAGNOSIS — M25551 Pain in right hip: Secondary | ICD-10-CM

## 2014-03-08 DIAGNOSIS — E785 Hyperlipidemia, unspecified: Secondary | ICD-10-CM

## 2014-03-08 NOTE — Patient Instructions (Signed)
Meralgia Paresthetica - lateral cutaneous nerve entrapment  "The lateral femoral cutaneous nerve, a pure sensory nerve, is susceptible to compression as it courses from the lumbosacral plexus, through the abdominal cavity, under the inguinal ligament, and into the subcutaneous tissue of the thigh. Meralgia paresthetica is the term used to describe the clinical syndrome of pain, dysesthesia, or both in the anterolateral thigh associated with compression of the nerve"

## 2014-03-08 NOTE — Progress Notes (Signed)
Pre visit review using our clinic review tool, if applicable. No additional management support is needed unless otherwise documented below in the visit note. 

## 2014-03-08 NOTE — Progress Notes (Signed)
Subjective:    Patient ID: Joshua King, male    DOB: April 27, 1960, 53 y.o.   MRN: [id number]  HPI 53 year old man presents for 3 month follow-up. He has a history of dyslipidemia. He takes both Lipitor and Zetia. His last lipid panel, in August, was very well controlled. He states his cardiologist recently started him on Zetia due to the particle sizes of his cholesterol.  He also complains of one-month cold-like symptoms with rhinorrhea. Denies cough, fevers, sore throat, ear pain, shortness of breath or chest pains. Occasionally has postnasal drip. For the past 2 weeks he has also had intermittent numbness and burning in the left lateral quad. No associated injury. No back pain. No weakness or falls in that leg. He states it occurs more frequently after sitting for long periods of time. When he sits, he shifts more of his weight onto that left hip and he thinks that could be a cause. He also inquired if the statin medication could also be a cause.   Review of Systems  Constitutional: Negative for fever, activity change, appetite change and fatigue.  HENT: Positive for congestion, postnasal drip and rhinorrhea. Negative for ear discharge, ear pain, facial swelling, hearing loss, nosebleeds, sinus pressure, sneezing and sore throat.   Eyes: Negative for redness and visual disturbance.  Respiratory: Negative for cough, chest tightness, shortness of breath and wheezing.   Cardiovascular: Negative for chest pain.  Gastrointestinal: Negative for nausea and vomiting.  Musculoskeletal: Positive for myalgias. Negative for back pain, joint swelling and gait problem.  Neurological: Negative for dizziness, weakness, light-headedness, numbness and headaches.   Past Medical History  Diagnosis Date  . GERD (gastroesophageal reflux disease)   . CAD (coronary artery disease), native coronary artery 05/2012    a. s/p NSTEMI 2/14: LHC 06/17/12: Mid LAD 90% and 80%, D2 99%, EF 55-60%. PCI: Promus Premier DES  to the mid LAD and Promus Premier DES to the D2  . Hyperlipidemia   . Hypertension   . Sinus bradycardia   . Chicken pox    Current Outpatient Prescriptions on File Prior to Visit  Medication Sig Dispense Refill  . aspirin EC 81 MG tablet Take 1 tablet (81 mg total) by mouth once.    Marland Kitchen. buPROPion (WELLBUTRIN XL) 150 MG 24 hr tablet Take 1 tablet by mouth daily.    . clopidogrel (PLAVIX) 75 MG tablet TAKE 1 TABLET (75 MG TOTAL) BY MOUTH DAILY. 90 tablet 0  . ezetimibe (ZETIA) 10 MG tablet Take 1 tablet (10 mg total) by mouth daily. 90 tablet 3  . nitroGLYCERIN (NITROSTAT) 0.4 MG SL tablet Place 1 tablet (0.4 mg total) under the tongue every 5 (five) minutes x 3 doses as needed for chest pain. 25 tablet 3   No current facility-administered medications on file prior to visit.   No Known Allergies Family History  Problem Relation Age of Onset  . CAD Father 2980  . Heart attack Father   . Arthritis Father   . Cancer Father     prostate  . Heart disease Father   . Alcohol abuse Mother   . Hypertension Mother    History   Social History  . Marital Status: Married    Spouse Name: N/A    Number of Children: 2  . Years of Education: N/A   Occupational History  .     Social History Main Topics  . Smoking status: Never Smoker   . Smokeless tobacco: Never Used  . Alcohol  Use: 3.0 - 4.0 oz/week    6-8 drink(s) per week  . Drug Use: No  . Sexual Activity: Not on file   Other Topics Concern  . Not on file   Social History Narrative       Objective:   Physical Exam  Constitutional: He is oriented to person, place, and time. He appears well-developed and well-nourished. No distress.  HENT:  Head: Normocephalic and atraumatic.  Right Ear: External ear normal.  Left Ear: External ear normal.  Mouth/Throat: Oropharynx is clear and moist. No oropharyngeal exudate.  Neck: Normal range of motion. Neck supple. No tracheal deviation present. No thyromegaly present.  Cardiovascular:  Normal rate, regular rhythm and normal heart sounds.   No murmur heard. Pulmonary/Chest: Effort normal and breath sounds normal. No respiratory distress. He has no wheezes.  Musculoskeletal: Normal range of motion. He exhibits no edema or tenderness.  Strength equal bilaterally 5/5  Lymphadenopathy:    He has no cervical adenopathy.  Neurological: He is alert and oriented to person, place, and time. He has normal reflexes. Coordination normal.  Skin: No rash noted. He is not diaphoretic.  Nursing note and vitals reviewed.       Assessment & Plan:  1.  Dyslipidemia- Continue on lipitor and zetia.  Lipid panel being drawn in Dec.  In aug, lipids were well controlled.  No adverse effects from the medication.  Continue to watch diet and implement weight loss activities.  2.  Viral rhinorrhea- no post nasal drip or cough.  No color changes in the mucous.  Continue OTC medications as needed for symptom relief. 3.  Numbness in lateral leg- likely meralgia paresthetica.  Patient told that this is self limiting.     Luiz OchoaMelissa Adolphe Fortunato, PA-S  Above reviewed. Patient has history of dyslipidemia with recent addition of his edea to high-dose atorvastatin. No adverse side effects. He has not been consistent in exercise since last visit. Weight is up 4 more pounds. Plans to start back more consistent exercise soon. No recent chest pains. No history of diabetes.  Viral URI type symptoms over the past several days. He has dry cough minimally and has some occasional postnasal drip symptoms.no fevers or chills. He does describe some left lateral thigh numbness without pain or weakness. No back symptoms. Symptoms seem to be worse after prolonged periods of sitting in one position. No lower extremity weakness. Symptoms are confined left lateral thigh  Objective-patient is alert and in no distress. Neck no mass. No JVD. Chest clear to auscultation. Heart regular rhythm and rate. Extremities straight leg raises are  negative. He has full strength lower extremities and symmetric reflexes.  Patient also mentions some chronic intermittent right hip pain. He's had corticosteroid injection previously per orthopedics and requesting referral back. No recent right hip injury  Assessment and plan: #1 hyperlipidemia. Continue current medications as per cardiology. We have discussed diet, exercise, and weight loss significantly in the past strongly advise getting back to some regular physical activity #2 viral URI. 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 Non-focal exam currently. We explained this is usually self-limited.  #4 right hip pain. History of reported osteoarthritis. Requesting referral back to orthopedics for consideration of corticosteroid injection  Bruce Burchette M.D.

## 2014-03-26 ENCOUNTER — Encounter: Payer: Self-pay | Admitting: Cardiovascular Disease

## 2014-03-26 ENCOUNTER — Ambulatory Visit (INDEPENDENT_AMBULATORY_CARE_PROVIDER_SITE_OTHER): Payer: BC Managed Care – PPO | Admitting: Cardiovascular Disease

## 2014-03-26 VITALS — BP 125/90 | HR 65 | Ht 69.5 in | Wt 229.0 lb

## 2014-03-26 DIAGNOSIS — I2510 Atherosclerotic heart disease of native coronary artery without angina pectoris: Secondary | ICD-10-CM

## 2014-03-26 DIAGNOSIS — I10 Essential (primary) hypertension: Secondary | ICD-10-CM

## 2014-03-26 DIAGNOSIS — E785 Hyperlipidemia, unspecified: Secondary | ICD-10-CM

## 2014-03-26 NOTE — Patient Instructions (Signed)
Your physician wants you to follow-up in:  6 months. You will receive a reminder letter in the mail two months in advance. If you don't receive a letter, please call our office to schedule the follow-up appointment.   

## 2014-03-26 NOTE — Progress Notes (Signed)
History of Present Illness: 53 yo male with history of CAD, HTN, HLD, GERD here today for cardiac follow up. [facility] with a NSTEMI. LHC 06/17/12: mid LAD 90% and 80%, D2 99%, EF 55-60%. PCI: Promus Premier DES to the mid LAD and Promus Premier DES to the D2. He has not been treated with a beta blocker due to bradycardia. His Lisinopril was stopped March 2014 at visit with Tereso NewcomerScott Weaver, PA-C due to dizziness and hypotension. Exercise stress test May 2014 without ischemia.   He is here today for follow up.  He has been feeling well. No chest pain or SOB. He has been exercising.   Primary Care Physician: Evelena PeatBruce Burchette  Last Lipid Profile:Lipid Panel     Component Value Date/Time   CHOL 152 09/04/2012 1037   TRIG 70 12/07/2013 0859   TRIG 52.0 09/04/2012 1037   HDL 58.00 09/04/2012 1037   CHOLHDL 3 09/04/2012 1037   VLDL 10.4 09/04/2012 1037   LDLCALC 91 12/07/2013 0859   LDLCALC 84 09/04/2012 1037     Past Medical History  Diagnosis Date  . GERD (gastroesophageal reflux disease)   . CAD (coronary artery disease), native coronary artery 05/2012    a. s/p NSTEMI 2/14: LHC 06/17/12: Mid LAD 90% and 80%, D2 99%, EF 55-60%. PCI: Promus Premier DES to the mid LAD and Promus Premier DES to the D2  . Hyperlipidemia   . Hypertension   . Sinus bradycardia   . Chicken pox     Past Surgical History  Procedure Laterality Date  . Appendectomy  1992  . Hernia repair  1999    inguinal  . Coronary angioplasty with stent placement  05/2012    90% then 80% mid LAD stenoses s/p DES x 1, 99% D2 lesion s/p DES x 1; LVEF 55-60%    Current Outpatient Prescriptions  Medication Sig Dispense Refill  . aspirin EC 81 MG tablet Take 1 tablet (81 mg total) by mouth once.    Marland Kitchen. atorvastatin (LIPITOR) 80 MG tablet Take 80 mg by mouth daily.   3  . buPROPion (WELLBUTRIN XL) 150 MG 24 hr tablet Take 1 tablet by mouth daily.    . clopidogrel (PLAVIX) 75 MG tablet TAKE 1 TABLET (75  MG TOTAL) BY MOUTH DAILY. 90 tablet 0  . ezetimibe (ZETIA) 10 MG tablet Take 1 tablet (10 mg total) by mouth daily. 90 tablet 3  . nitroGLYCERIN (NITROSTAT) 0.4 MG SL tablet Place 1 tablet (0.4 mg total) under the tongue every 5 (five) minutes x 3 doses as needed for chest pain. 25 tablet 3   No current facility-administered medications for this visit.    No Known Allergies  History   Social History  . Marital Status: Married    Spouse Name: N/A    Number of Children: 2  . Years of Education: N/A   Occupational History  .     Social History Main Topics  . Smoking status: Never Smoker   . Smokeless tobacco: Never Used  . Alcohol Use: 3.0 - 4.0 oz/week    6-8 drink(s) per week  . Drug Use: No  . Sexual Activity: Not on file   Other Topics Concern  . Not on file   Social History Narrative    Family History  Problem Relation Age of Onset  . CAD Father 1380  . Heart attack Father   . Arthritis Father   . Cancer Father  prostate  . Heart disease Father   . Alcohol abuse Mother   . Hypertension Mother     Review of Systems:  As stated in the HPI and otherwise negative.   BP 125/90 mmHg  Pulse 65  Ht 5' 9.5" (1.765 m)  Wt 229 lb (103.874 kg)  BMI 33.34 kg/m2  Physical Examination: General: Well developed, well nourished, NAD HEENT: OP clear, mucus membranes moist SKIN: warm, dry. No rashes. Neuro: No focal deficits Musculoskeletal: Muscle strength 5/5 all ext Psychiatric: Mood and affect normal Neck: No JVD, no carotid bruits, no thyromegaly, no lymphadenopathy. Lungs:Clear bilaterally, no wheezes, rhonci, crackles Cardiovascular: Regular rate and rhythm. No murmurs, gallops or rubs. Abdomen:Soft. Bowel sounds present. Non-tender.  Extremities: No lower extremity edema. Pulses are 2 + in the bilateral DP/PT.  EKG: NSR, rate 65 bpm. Poor R wave progression  Assessment and Plan:   1. CAD: He is stable. Stress test May 2014 without ischemia. Continue dual  anti-platelet therapy with ASA and Plavix 75mg  daily. Continue statin and Zetia. He does not tolerate beta blockers due to bradycardia and does not tolerate Ace-inh due to hypotension.   2. HTN: BP well controlled. No changes.   3. Hyperlipidemia: Lipids well controlled with statin but Zetia added due to elevated LDL-P. Last check August 2015 with NMR profile pending next week. Continue statin and Zetia.

## 2014-03-30 ENCOUNTER — Other Ambulatory Visit: Payer: BC Managed Care – PPO

## 2014-04-01 ENCOUNTER — Encounter (HOSPITAL_COMMUNITY): Payer: Self-pay | Admitting: Cardiovascular Disease

## 2014-04-25 ENCOUNTER — Other Ambulatory Visit: Payer: Self-pay | Admitting: Cardiovascular Disease

## 2014-04-26 ENCOUNTER — Other Ambulatory Visit: Payer: Self-pay | Admitting: Cardiovascular Disease

## 2014-04-26 ENCOUNTER — Encounter: Payer: Self-pay | Admitting: Cardiovascular Disease

## 2014-04-26 ENCOUNTER — Other Ambulatory Visit: Payer: Self-pay | Admitting: Physician Assistant

## 2014-04-27 ENCOUNTER — Other Ambulatory Visit: Payer: Self-pay

## 2014-04-28 ENCOUNTER — Other Ambulatory Visit: Payer: Self-pay

## 2014-06-08 ENCOUNTER — Ambulatory Visit: Payer: Self-pay | Admitting: Family Medicine

## 2014-11-08 ENCOUNTER — Other Ambulatory Visit: Payer: Self-pay | Admitting: Cardiovascular Disease

## 2014-11-11 ENCOUNTER — Other Ambulatory Visit: Payer: Self-pay | Admitting: Cardiovascular Disease

## 2014-11-29 ENCOUNTER — Other Ambulatory Visit: Payer: Self-pay | Admitting: Cardiovascular Disease

## 2014-12-01 ENCOUNTER — Telehealth: Payer: Self-pay | Admitting: Physician Assistant

## 2014-12-01 NOTE — Telephone Encounter (Signed)
Follow Up  Per Okey Regal- Routing to Triage- due to being [facility] pt

## 2014-12-01 NOTE — Telephone Encounter (Signed)
New Message  Pt messaged the scheduling pool for an appointment today (8/10) or tomorrow (8/11) since he is in town for this week to discuss an ongoing BP issue. Pt would not specify the degree of the issue but requested to speak w/ the [facility]. Please call back and discuss.

## 2014-12-01 NOTE — Telephone Encounter (Signed)
Call from pt who stated he has been experiencing "spikes" in his Bp during the night.  Recently while traveling his Bp was as high as 170/107 which resulted in an ED visit.  Mr Kincy explained he had additional testing during this visit including a "nuclear stress test," all of which were "clean."  Explained it would be approximately one month for an appt with a [facility] provider, and recommended pt contact his PCP to which pt agreed.  Rebecka Apley, RN

## 2014-12-23 ENCOUNTER — Other Ambulatory Visit: Payer: Self-pay | Admitting: Cardiovascular Disease

## 2014-12-23 ENCOUNTER — Other Ambulatory Visit: Payer: Self-pay | Admitting: Physician Assistant

## 2014-12-23 NOTE — Telephone Encounter (Signed)
Should the patient be taking this? I see that it was stopped by Tereso Newcomer 06/2012, but at his 02/23/13 visit it says LABS IN 2 WEEKS 03/09/13 FOR A BMET SINCE WE HAVE RE-STARTED LISINOPRIL. Please advise. Thanks, MI

## 2014-12-24 ENCOUNTER — Encounter: Payer: Self-pay | Admitting: Family Medicine

## 2014-12-24 ENCOUNTER — Ambulatory Visit (INDEPENDENT_AMBULATORY_CARE_PROVIDER_SITE_OTHER): Payer: BLUE CROSS/BLUE SHIELD | Admitting: Family Medicine

## 2014-12-24 VITALS — BP 140/90 | HR 57 | Temp 98.2°F | Wt 233.0 lb

## 2014-12-24 DIAGNOSIS — F411 Generalized anxiety disorder: Secondary | ICD-10-CM | POA: Diagnosis not present

## 2014-12-24 DIAGNOSIS — M791 Myalgia, unspecified site: Secondary | ICD-10-CM

## 2014-12-24 DIAGNOSIS — I10 Essential (primary) hypertension: Secondary | ICD-10-CM

## 2014-12-24 DIAGNOSIS — G4733 Obstructive sleep apnea (adult) (pediatric): Secondary | ICD-10-CM

## 2014-12-24 MED ORDER — LISINOPRIL 5 MG PO TABS: 5.0000 mg | ORAL_TABLET | Freq: Every day | ORAL | Status: DC

## 2014-12-24 MED ORDER — ALPRAZOLAM 0.5 MG PO TABS: 0.5000 mg | ORAL_TABLET | Freq: Three times a day (TID) | ORAL | Status: DC | PRN

## 2014-12-24 NOTE — Progress Notes (Signed)
Pre visit review using our clinic review tool, if applicable. No additional management support is needed unless otherwise documented below in the visit note. 

## 2014-12-24 NOTE — Patient Instructions (Signed)
Continue to monitor blood pressure and get back on the Lisinopril if BP increasing over 140/90 We will set up pulmonary referral to reassess your CPAP.

## 2014-12-24 NOTE — Progress Notes (Signed)
Subjective:    Patient ID: Joshua King, male    DOB: August 20, 1960, 54 y.o.   MRN: [id number]  HPI   Patient has history of obesity, obstructive sleep apnea, hypertension, dyslipidemia, CAD , and GERD. He is seen today with some recent nonspecific symptoms. His job requires frequent travel. He states he has had 3 episodes in recent months where he woke up feeling "dizzy" and nauseous and very anxious. He denies any clear vertigo symptoms. He had episode 03/13/15 in [place] where he went to emergency room with blood pressure 170/107. He reportedly had negative nuclear stress study then.  He was not sure if elevated blood pressure caused his symptoms or symptoms made him anxious and contributed to elevated blood pressure.    He has not been taking his atorvastatin secondary to myalgias- which resolved after discontinuing. He has follow-up with cardiology next week and we've asked that he discuss statin use further with family that time. He had been prescribed by cardiology low-dose lisinopril 2.5 mg daily and did not start taking this back until recent ER visit as above. He does recall occasional palpitations with events above but his never palpated any bradycardia or tachycardia.   He queries whether he may be having problem with CPAP as all 3 episodes in recent months occurred between 1:30 and 4:30 AM. He also relates remote diagnosis of "panic attack ". He has felt very anxious during episodes.  He has been recently exercising without difficulty.  Past Medical History  Diagnosis Date  . GERD (gastroesophageal reflux disease)   . CAD (coronary artery disease), native coronary artery 05/2012    a. s/p NSTEMI 2/14: LHC 06/17/12: Mid LAD 90% and 80%, D2 99%, EF 55-60%. PCI: Promus Premier DES to the mid LAD and Promus Premier DES to the D2  . Hyperlipidemia   . Hypertension   . Sinus bradycardia   . Chicken pox    Past Surgical History  Procedure Laterality Date  . Appendectomy  1992  .  Hernia repair  1999    inguinal  . Coronary angioplasty with stent placement  05/2012    90% then 80% mid LAD stenoses s/p DES x 1, 99% D2 lesion s/p DES x 1; LVEF 55-60%  . Left heart catheterization with coronary angiogram N/A 06/17/2012    Procedure: LEFT HEART CATHETERIZATION WITH CORONARY ANGIOGRAM;  Surgeon: Kathleene Hazel, MD;  Location: [facility] CATH LAB;  Service: Cardiovascular;  Laterality: N/A;  . Percutaneous coronary stent intervention (pci-s)  06/17/2012    Procedure: PERCUTANEOUS CORONARY STENT INTERVENTION (PCI-S);  Surgeon: Kathleene Hazel, MD;  Location: [facility] CATH LAB;  Service: Cardiovascular;;    reports that he has never smoked. He has never used smokeless tobacco. He reports that he drinks about 3.0 - 4.0 oz of alcohol per week. He reports that he does not use illicit drugs. family history includes Alcohol abuse in his mother; Arthritis in his father; CAD (age of onset: 43) in his father; Cancer in his father; Heart attack in his father; Heart disease in his father; Hypertension in his mother. No Known Allergies    Review of Systems  Constitutional: Negative for fatigue.  Eyes: Negative for visual disturbance.  Respiratory: Negative for cough, chest tightness and shortness of breath.   Cardiovascular: Negative for chest pain and leg swelling.  Gastrointestinal: Negative for abdominal pain.  Genitourinary: Negative for dysuria.  Neurological: Negative for dizziness, syncope, weakness, light-headedness and headaches.       Objective:  Physical Exam  Constitutional: He is oriented to person, place, and time. He appears well-developed and well-nourished.  HENT:  Right Ear: External ear normal.  Left Ear: External ear normal.  Neck: Neck supple. No thyromegaly present.  Cardiovascular: Normal rate and regular rhythm.   Pulmonary/Chest: Effort normal and breath sounds normal. No respiratory distress. He has no wheezes. He has no rales.  Musculoskeletal: He  exhibits no edema.  Neurological: He is alert and oriented to person, place, and time. No cranial nerve deficit.          Assessment & Plan:   #1 recent elevated blood pressure. Blood pressure today sitting 122/80 and standing 120/80. Recent elevated blood pressure during ER visit possibly related to anxiety. We've recommended continued close monitoring and continue low-dose lisinopril especially if blood pressures consistently over 140/90  #2 history obstructive sleep apnea. Patient has questions regarding his CPAP. We've recommended referral to pulmonary to further assess  #3 episodic symptoms as above including dizziness, palpitations , anxiousness. Question panic disorder. wrote for limited alprazolam 0.5 mg 1 every 8 hours for severe anxiety symptoms. If he develops above symptoms more frequently consider SSRI for suppression. #4 recent myalgias possibly related to Lipitor. We explained that he may be able to tolerate other statins such as Crestor and he wishes to discuss with cardiology

## 2014-12-29 NOTE — Progress Notes (Signed)
Cardiology Office Note   Date:  12/30/2014   ID:  Joshua King, DOB [birth year], MRN [id number]  PCP:  Joshua Covey, MD  Cardiologist:  Dr. Verne Carrow   Electrophysiologist:  N/a   Chief Complaint  Patient presents with  . Follow-up    HTN  . Coronary Artery Disease     History of Present Illness: Joshua King is a 54 y.o. male with a hx of CAD, HTN, HLD, GERD. He was admitted to [facility] 06/17/12 with a NSTEMI. LHC 06/17/12: mid LAD 90% and 80%, D2 99%, EF 55-60%. PCI: Promus Premier DES to the mid LAD and Promus Premier DES to the D2. He has not been treated with a beta blocker due to bradycardia. His Lisinopril was stopped March 2014 at visit with me due to dizziness and hypotension. Exercise stress test May 2014 without ischemia. Last seen by Dr. Verne Carrow 12/15.  Patient called in last month with uncontrolled blood pressure.  He had also gone to the emergency room while out of town [facility]) and apparently had a stress test. He was asked to follow-up with his primary care physician.  Here today by himself. Over the last several months, he's had 3-4 episodes of sudden awakening in the middle of the night with dizziness, nausea, shortness of breath. Blood pressure is often quite elevated at these times. He did have rapid palpitations on one occasion. These symptoms always occur in the middle of the night between 1 and 3 AM. It always occurs when he is out of town. He travels quite a bit for his job. He denies orthopnea. He sleeps with CPAP. He denies edema. He exercises several times a week. He denies exertional chest pain or shortness of breath. He stopped atorvastatin secondary to myalgias. He has seen his primary care doctor. He was given Xanax to take as needed. It was suspected he may be having panic attacks. Of note, the patient was admitted to a hospital in [place] in July. The patient tells me that he ruled out for myocardial infarction and nuclear  stress test was normal.   Studies/Reports Reviewed Today:  LHC 2/14 Left main: No obstructive disease noted.  LAD:  Mid 90%, 80%. D2 99%  LCx:  No obstructive disease is noted.  RCA: no obstructive disease noted.  LV-gram: LVEF=55-60% Impression: 1. Severe double vessel CAD with severe stenosis in the Diagonal branch and serial severe stenoses in the mid LAD 2. Unstable angina 3. Preserved LV systolic function 4. Successful PTCA/DES x 1 mid LAD 5. Successful PTCA/DES x 1 Diagonal branch   ETT 5/14 ETT Interpretation: normal - no evidence of ischemia by ST analysis   Past Medical History  Diagnosis Date  . GERD (gastroesophageal reflux disease)   . CAD (coronary artery disease), native coronary artery 05/2012    a. s/p NSTEMI 2/14: LHC 06/17/12: Mid LAD 90% and 80%, D2 99%, EF 55-60%. PCI: Promus Premier DES to the mid LAD and Promus Premier DES to the D2  . Hyperlipidemia   . Hypertension   . Sinus bradycardia   . Chicken pox     Past Surgical History  Procedure Laterality Date  . Appendectomy  1992  . Hernia repair  1999    inguinal  . Coronary angioplasty with stent placement  05/2012    90% then 80% mid LAD stenoses s/p DES x 1, 99% D2 lesion s/p DES x 1; LVEF 55-60%  . Left heart catheterization with coronary angiogram N/A 06/17/2012  Procedure: LEFT HEART CATHETERIZATION WITH CORONARY ANGIOGRAM;  Surgeon: Kathleene Hazel, MD;  Location[facility] CATH LAB;  Service: Cardiovascular;  Laterality: N/A;  . Percutaneous coronary stent intervention (pci-s)  06/17/2012    Procedure: PERCUTANEOUS CORONARY STENT INTERVENTION (PCI-S);  Surgeon: Kathleene Hazel, MD;  Location: [facility] CATH LAB;  Service: Cardiovascular;;     Current Outpatient Prescriptions  Medication Sig Dispense Refill  . ALPRAZolam (XANAX) 0.5 MG tablet Take 1 tablet (0.5 mg total) by mouth 3 (three) times daily as needed for anxiety. 30 tablet 0  . aspirin EC 81 MG tablet Take 1 tablet (81 mg  total) by mouth once.    Marland Kitchen buPROPion (WELLBUTRIN XL) 150 MG 24 hr tablet Take 1 tablet by mouth daily.    . clopidogrel (PLAVIX) 75 MG tablet TAKE 1 TABLET (75 MG TOTAL) BY MOUTH DAILY. 30 tablet 0  . lisinopril (PRINIVIL,ZESTRIL) 5 MG tablet Take 1 tablet (5 mg total) by mouth daily. (Patient taking differently: Take 5 mg by mouth daily as needed (for blood pressure). ) 30 tablet 5  . NITROSTAT 0.4 MG SL tablet PLACE 1 TAB UNDER THE TONGUE EVERY 5 MINUTES X3 DOSES AS NEEDED FOR CHEST PAIN 25 tablet 2  . rosuvastatin (CRESTOR) 10 MG tablet Take 1 tablet (10 mg total) by mouth daily. Monday, Wednesday and Friday only 30 tablet 3   No current facility-administered medications for this visit.    Allergies:   Review of patient's allergies indicates no known allergies.    Social History:  The patient  reports that he has never smoked. He has never used smokeless tobacco. He reports that he drinks about 3.0 - 4.0 oz of alcohol per week. He reports that he does not use illicit drugs.   Family History:  The patient's family history includes Alcohol abuse in his mother; Arthritis in his father; CAD (age of onset: 24) in his father; Cancer in his father; Heart attack in his father; Heart disease in his father; Hypertension in his mother.    ROS:   Please see the history of present illness.   Review of Systems  Respiratory: Positive for shortness of breath.   All other systems reviewed and are negative.     PHYSICAL EXAM: VS:  BP 118/60 mmHg  Pulse 55  Ht 5' 9.5" (1.765 m)  Wt 235 lb (106.595 kg)  BMI 34.22 kg/m2  SpO2 98%    Wt Readings from Last 3 Encounters:  12/30/14 235 lb (106.595 kg)  12/24/14 233 lb (105.688 kg)  03/26/14 229 lb (103.874 kg)     GEN: Well nourished, well developed, in no acute distress HEENT: normal Neck: no JVD, no masses Cardiac:  Normal S1/S2, RRR; no murmur ,  no rubs or gallops, no edema   Respiratory:  clear to auscultation bilaterally, no wheezing,  rhonchi or rales. GI: soft, nontender, nondistended, + BS MS: no deformity or atrophy Skin: warm and dry  Neuro:  CNs II-XII intact, Strength and sensation are intact Psych: Normal affect   EKG:  EKG is ordered today.  It demonstrates:   Sinus bradycardia, HR 55, no change from prior tracing   Recent Labs: 12/30/2014: ALT 33; BUN 17; Creatinine, Ser 1.09; Hemoglobin 14.1; Platelets 248.0; Potassium 4.2; Pro B Natriuretic peptide (BNP) 16.0; Sodium 138    Lipid Panel    Component Value Date/Time   CHOL 258* 12/30/2014 1051   CHOL 165 12/07/2013 0859   TRIG 66.0 12/30/2014 1051   TRIG 70 12/07/2013  0859   HDL 61.80 12/30/2014 1051   HDL 60 12/07/2013 0859   CHOLHDL 4 12/30/2014 1051   VLDL 13.2 12/30/2014 1051   LDLCALC 183* 12/30/2014 1051   LDLCALC 91 12/07/2013 0859      ASSESSMENT AND PLAN:   Shortness of breath: The patient presents with several episodes of sudden awakening in the middle of the night with dizziness, nausea, dyspnea, elevated BP and palpitations. He was admitted to the hospital in [place] in July. He had a negative cardiac workup. He does tell me that for 3 out of 4 of the episodes, he did drink more alcohol than normal. He does have sleep apnea. Question if he is having episodes of atrial fibrillation. He does travel quite a bit. However, his symptoms are not consistent with pulmonary embolism. Overall, etiology is not entirely clear.  -  Labs: BMET, CBC, LFTs, BNP, d-dimer.  -  Obtain 2-D echocardiogram  -  Obtain event monitor 30 days  -  Request records from hospital in [place]  CAD: As noted, records from [place] will be obtained. Continue aspirin, Plavix.  Hypertension: Controlled off medications. He takes lisinopril if his blood pressure is significantly elevated.  Hyperlipidemia:  He stopped taking statin therapy secondary to myalgias. I have encouraged him to try Crestor 10 mg every Monday, Wednesday, Friday. Check lipids and LFTs in 3  months.  Sleep apnea: He has follow-up with pulmonology soon.     Medication Changes: Current medicines are reviewed at length with the patient today.  Concerns regarding medicines are as outlined above.  The following changes have been made:   Discontinued Medications   ATORVASTATIN (LIPITOR) 80 MG TABLET    Take 80 mg by mouth daily.    EZETIMIBE (ZETIA) 10 MG TABLET    Take 1 tablet (10 mg total) by mouth daily.   Modified Medications   No medications on file   New Prescriptions   ROSUVASTATIN (CRESTOR) 10 MG TABLET    Take 1 tablet (10 mg total) by mouth daily. Monday, Wednesday and Friday only    Labs/ tests ordered today include:   Orders Placed This Encounter  Procedures  . Basic Metabolic Panel (BMET)  . CBC w/Diff  . Hepatic function panel  . B Nat Peptide  . D-Dimer, Quantitative  . Hepatic function panel  . Lipid Profile  . Lipid Profile  . Cardiac event monitor  . EKG 12-Lead  . Echocardiogram      Disposition:    FU with Dr. Verne Carrow 4-6 weeks.     Signed, Brynda Rim, MHS 12/30/2014 4:38 PM    [facility] [address] [place], [place], [place]  [address] Phone: [phone]; Fax: [phone]

## 2014-12-29 NOTE — Telephone Encounter (Signed)
Pt saw primary care on 12/24/14 and new prescription was sent in at that visit

## 2014-12-30 ENCOUNTER — Ambulatory Visit (INDEPENDENT_AMBULATORY_CARE_PROVIDER_SITE_OTHER): Payer: BLUE CROSS/BLUE SHIELD | Admitting: Physician Assistant

## 2014-12-30 ENCOUNTER — Encounter: Payer: Self-pay | Admitting: Physician Assistant

## 2014-12-30 VITALS — BP 118/60 | HR 55 | Ht 69.5 in | Wt 235.0 lb

## 2014-12-30 DIAGNOSIS — I2510 Atherosclerotic heart disease of native coronary artery without angina pectoris: Secondary | ICD-10-CM | POA: Diagnosis not present

## 2014-12-30 DIAGNOSIS — E785 Hyperlipidemia, unspecified: Secondary | ICD-10-CM | POA: Diagnosis not present

## 2014-12-30 DIAGNOSIS — R42 Dizziness and giddiness: Secondary | ICD-10-CM

## 2014-12-30 DIAGNOSIS — G4733 Obstructive sleep apnea (adult) (pediatric): Secondary | ICD-10-CM

## 2014-12-30 DIAGNOSIS — R002 Palpitations: Secondary | ICD-10-CM

## 2014-12-30 DIAGNOSIS — I10 Essential (primary) hypertension: Secondary | ICD-10-CM

## 2014-12-30 DIAGNOSIS — R0602 Shortness of breath: Secondary | ICD-10-CM

## 2014-12-30 LAB — LIPID PANEL
CHOLESTEROL: 258 mg/dL — AB (ref 0–200)
HDL: 61.8 mg/dL (ref >39.00)
LDL Cholesterol: 183 mg/dL — ABNORMAL HIGH (ref 0–99)
NONHDL: 195.94
TRIGLYCERIDES: 66 mg/dL (ref 0.0–149.0)
Total CHOL/HDL Ratio: 4
VLDL: 13.2 mg/dL (ref 0.0–40.0)

## 2014-12-30 LAB — BASIC METABOLIC PANEL
BUN: 17 mg/dL (ref 6–23)
CALCIUM: 9.3 mg/dL (ref 8.4–10.5)
CHLORIDE: 105 meq/L (ref 96–112)
CO2: 25 meq/L (ref 19–32)
Creatinine, Ser: 1.09 mg/dL (ref 0.40–1.50)
GFR: 74.29 mL/min (ref >60.00)
GLUCOSE: 96 mg/dL (ref 70–99)
POTASSIUM: 4.2 meq/L (ref 3.5–5.1)
SODIUM: 138 meq/L (ref 135–145)

## 2014-12-30 LAB — HEPATIC FUNCTION PANEL
ALBUMIN: 4.4 g/dL (ref 3.5–5.2)
ALT: 33 U/L (ref 0–53)
AST: 25 U/L (ref 0–37)
Alkaline Phosphatase: 34 U/L — ABNORMAL LOW (ref 39–117)
Bilirubin, Direct: 0.1 mg/dL (ref 0.0–0.3)
TOTAL PROTEIN: 7.2 g/dL (ref 6.0–8.3)
Total Bilirubin: 0.9 mg/dL (ref 0.2–1.2)

## 2014-12-30 LAB — CBC WITH DIFFERENTIAL/PLATELET
BASOS PCT: 0.4 % (ref 0.0–3.0)
Basophils Absolute: 0 10*3/uL (ref 0.0–0.1)
EOS PCT: 1 % (ref 0.0–5.0)
Eosinophils Absolute: 0 10*3/uL (ref 0.0–0.7)
HEMATOCRIT: 41.7 % (ref 39.0–52.0)
Hemoglobin: 14.1 g/dL (ref 13.0–17.0)
LYMPHS ABS: 1.3 10*3/uL (ref 0.7–4.0)
LYMPHS PCT: 28 % (ref 12.0–46.0)
MCHC: 33.7 g/dL (ref 30.0–36.0)
MCV: 103.6 fl — AB (ref 78.0–100.0)
MONOS PCT: 8.8 % (ref 3.0–12.0)
Monocytes Absolute: 0.4 10*3/uL (ref 0.1–1.0)
NEUTROS ABS: 2.8 10*3/uL (ref 1.4–7.7)
NEUTROS PCT: 61.8 % (ref 43.0–77.0)
PLATELETS: 248 10*3/uL (ref 150.0–400.0)
RBC: 4.03 Mil/uL — ABNORMAL LOW (ref 4.22–5.81)
RDW: 13.4 % (ref 11.5–15.5)
WBC: 4.6 10*3/uL (ref 4.0–10.5)

## 2014-12-30 LAB — BRAIN NATRIURETIC PEPTIDE: PRO B NATRI PEPTIDE: 16 pg/mL (ref 0.0–100.0)

## 2014-12-30 MED ORDER — ROSUVASTATIN CALCIUM 10 MG PO TABS: 10.0000 mg | ORAL_TABLET | Freq: Every day | ORAL | Status: DC

## 2014-12-30 NOTE — Patient Instructions (Addendum)
Medication Instructions:  Your physician has recommended you make the following change in your medication:  Start Crestor ( 10 mg ) on Monday, Wednesday and Friday   Labwork: Your physician recommends that have lab work: bmet/cbc/lft/bnp/d dimer/lipid  Your physician recommends that you return for a FASTING lipid profile: on December 9 between 8-5    Testing/Procedures: Your physician has requested that you have an echocardiogram. Echocardiography is a painless test that uses sound waves to create images of your heart. It provides your doctor with information about the size and shape of your heart and how well your heart's chambers and valves are working. This procedure takes approximately one hour. There are no restrictions for this procedure.  Your physician has recommended that you wear an event monitor. Event monitors are medical devices that record the heart's electrical activity. Doctors most often [place] these monitors to diagnose arrhythmias. Arrhythmias are problems with the speed or rhythm of the heartbeat. The monitor is a small, portable device. You can wear one while you do your normal daily activities. This is usually used to diagnose what is causing palpitations/syncope (passing out).    Follow-Up: Your physician recommends that you keep your scheduled  follow-up appointment with Dr. Clifton Haik on Friday, October 14 @ 9:30   Any Other Special Instructions Will Be Listed Below (If Applicable).  Medical records filled out paperwork today to receive records from [place], [place]

## 2014-12-31 ENCOUNTER — Telehealth: Payer: Self-pay

## 2014-12-31 DIAGNOSIS — R0602 Shortness of breath: Secondary | ICD-10-CM

## 2014-12-31 NOTE — Telephone Encounter (Signed)
Spoke with pt about lab results. Pt verbalized understanding. Advised him that d-dimer was not done and he agreed to come back first thing Monday morning and have drawn. States that he could not come today due to being in a meeting and his symptoms have been periodic over the last 90 days.  I reviewed with Joshua Bridegroom, RN and she spoke with patient who advised that his understanding from last ov with Joshua Newcomer, PA is that PE is unlikely and this test was to completely rule it out; states he ran 3 miles this morning and had no problems.   He is in agreement to start Crestor and will follow-up on B12, folate at a later time.

## 2014-12-31 NOTE — Telephone Encounter (Signed)
-----   Message from Beatrice Lecher, [place] sent at 12/30/2014  5:03 PM EDT ----- K+, Creatinine ok LFTs ok Cholesterol way too high - goal LDL < 70 >>> start Crestor as outlined at OV today BNP normal - no CHF Hgb normal - MCV elevated which usually points to vitamin deficiency >> please arrange FU B12, folate DDimer pending - can we check on results?? Tereso Newcomer, PA-C   12/30/2014 5:03 PM

## 2015-01-03 ENCOUNTER — Other Ambulatory Visit (INDEPENDENT_AMBULATORY_CARE_PROVIDER_SITE_OTHER): Payer: BLUE CROSS/BLUE SHIELD | Admitting: *Deleted

## 2015-01-03 DIAGNOSIS — R0602 Shortness of breath: Secondary | ICD-10-CM

## 2015-01-03 LAB — D-DIMER, QUANTITATIVE: D-Dimer, Quant: 0.27 ug/mL-FEU (ref 0.00–0.48)

## 2015-01-06 ENCOUNTER — Other Ambulatory Visit: Payer: Self-pay | Admitting: Physician Assistant

## 2015-01-06 DIAGNOSIS — R002 Palpitations: Secondary | ICD-10-CM

## 2015-01-06 DIAGNOSIS — R42 Dizziness and giddiness: Secondary | ICD-10-CM

## 2015-01-07 ENCOUNTER — Other Ambulatory Visit (HOSPITAL_COMMUNITY): Payer: BLUE CROSS/BLUE SHIELD

## 2015-01-07 ENCOUNTER — Ambulatory Visit (HOSPITAL_COMMUNITY): Payer: BLUE CROSS/BLUE SHIELD | Attending: Cardiovascular Disease

## 2015-01-07 ENCOUNTER — Other Ambulatory Visit: Payer: Self-pay

## 2015-01-07 ENCOUNTER — Ambulatory Visit (INDEPENDENT_AMBULATORY_CARE_PROVIDER_SITE_OTHER): Payer: BLUE CROSS/BLUE SHIELD

## 2015-01-07 DIAGNOSIS — E785 Hyperlipidemia, unspecified: Secondary | ICD-10-CM | POA: Diagnosis not present

## 2015-01-07 DIAGNOSIS — G4733 Obstructive sleep apnea (adult) (pediatric): Secondary | ICD-10-CM | POA: Insufficient documentation

## 2015-01-07 DIAGNOSIS — I77819 Aortic ectasia, unspecified site: Secondary | ICD-10-CM | POA: Insufficient documentation

## 2015-01-07 DIAGNOSIS — I517 Cardiomegaly: Secondary | ICD-10-CM | POA: Insufficient documentation

## 2015-01-07 DIAGNOSIS — I10 Essential (primary) hypertension: Secondary | ICD-10-CM | POA: Diagnosis not present

## 2015-01-07 DIAGNOSIS — I2510 Atherosclerotic heart disease of native coronary artery without angina pectoris: Secondary | ICD-10-CM | POA: Diagnosis not present

## 2015-01-07 DIAGNOSIS — R002 Palpitations: Secondary | ICD-10-CM

## 2015-01-07 DIAGNOSIS — R42 Dizziness and giddiness: Secondary | ICD-10-CM | POA: Diagnosis not present

## 2015-01-08 ENCOUNTER — Encounter: Payer: Self-pay | Admitting: Physician Assistant

## 2015-01-08 ENCOUNTER — Other Ambulatory Visit: Payer: Self-pay | Admitting: Physician Assistant

## 2015-01-08 DIAGNOSIS — I77810 Thoracic aortic ectasia: Secondary | ICD-10-CM

## 2015-01-10 ENCOUNTER — Encounter: Payer: Self-pay | Admitting: Physician Assistant

## 2015-01-10 ENCOUNTER — Telehealth: Payer: Self-pay | Admitting: *Deleted

## 2015-01-10 NOTE — Telephone Encounter (Signed)
Lmptcb to go over echo results and recommendation schedule CT-A dx dialted aorta.

## 2015-01-11 NOTE — Telephone Encounter (Signed)
Joshua King, [place] s/w pt about his echo results and the need to have a chest ct-a. Pt agreeable to plan of care. I will have [facility] call and set up CT-A. Pt agreeable.

## 2015-01-19 ENCOUNTER — Other Ambulatory Visit: Payer: Self-pay | Admitting: *Deleted

## 2015-01-19 ENCOUNTER — Telehealth: Payer: Self-pay | Admitting: Physician Assistant

## 2015-01-19 DIAGNOSIS — I77810 Thoracic aortic ectasia: Secondary | ICD-10-CM

## 2015-01-19 NOTE — Telephone Encounter (Signed)
Please make sure radiology does a Chest CTA gated for the heart to assess the aorta. 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  Cardiac CTA does not need to be done and can be DC'd. There is an order for both. Tereso Newcomer, PA-C   01/19/2015 1:24 PM

## 2015-01-19 NOTE — Addendum Note (Signed)
Addended by: Tarri Fuller on: 01/19/2015 04:33 PM   Modules accepted: Orders

## 2015-01-19 NOTE — Telephone Encounter (Signed)
Order corrected to show to be done at [facility].

## 2015-01-19 NOTE — Telephone Encounter (Signed)
Order placed today for the Chest CT-A per Tereso Newcomer, PA.Marland Kitchen

## 2015-01-25 ENCOUNTER — Ambulatory Visit (HOSPITAL_COMMUNITY): Payer: BLUE CROSS/BLUE SHIELD

## 2015-01-27 ENCOUNTER — Other Ambulatory Visit (HOSPITAL_COMMUNITY): Payer: BLUE CROSS/BLUE SHIELD

## 2015-02-03 ENCOUNTER — Encounter (HOSPITAL_COMMUNITY): Payer: Self-pay

## 2015-02-03 ENCOUNTER — Ambulatory Visit (HOSPITAL_COMMUNITY)
Admission: RE | Admit: 2015-02-03 | Discharge: 2015-02-03 | Disposition: A | Discharge status: Home / Self Care | Payer: BLUE CROSS/BLUE SHIELD | Source: Ambulatory Visit | Attending: Physician Assistant | Admitting: Physician Assistant

## 2015-02-03 DIAGNOSIS — I2510 Atherosclerotic heart disease of native coronary artery without angina pectoris: Secondary | ICD-10-CM | POA: Diagnosis not present

## 2015-02-03 DIAGNOSIS — J984 Other disorders of lung: Secondary | ICD-10-CM | POA: Diagnosis not present

## 2015-02-03 DIAGNOSIS — Z955 Presence of coronary angioplasty implant and graft: Secondary | ICD-10-CM | POA: Diagnosis not present

## 2015-02-03 DIAGNOSIS — I77810 Thoracic aortic ectasia: Secondary | ICD-10-CM | POA: Diagnosis not present

## 2015-02-03 MED ORDER — IOHEXOL 350 MG/ML SOLN
80.0000 mL | Freq: Once | INTRAVENOUS | Status: AC | PRN
  Administered 2015-02-03: 100 mL via INTRAVENOUS

## 2015-02-04 ENCOUNTER — Ambulatory Visit (INDEPENDENT_AMBULATORY_CARE_PROVIDER_SITE_OTHER): Payer: BLUE CROSS/BLUE SHIELD | Admitting: Cardiovascular Disease

## 2015-02-04 ENCOUNTER — Ambulatory Visit: Payer: BLUE CROSS/BLUE SHIELD | Admitting: Cardiovascular Disease

## 2015-02-04 ENCOUNTER — Encounter: Payer: Self-pay | Admitting: Cardiovascular Disease

## 2015-02-04 VITALS — BP 102/70 | HR 68 | Ht 69.5 in | Wt 231.0 lb

## 2015-02-04 DIAGNOSIS — I2510 Atherosclerotic heart disease of native coronary artery without angina pectoris: Secondary | ICD-10-CM | POA: Diagnosis not present

## 2015-02-04 DIAGNOSIS — I10 Essential (primary) hypertension: Secondary | ICD-10-CM

## 2015-02-04 DIAGNOSIS — E785 Hyperlipidemia, unspecified: Secondary | ICD-10-CM

## 2015-02-04 DIAGNOSIS — I712 Thoracic aortic aneurysm, without rupture, unspecified: Secondary | ICD-10-CM

## 2015-02-04 DIAGNOSIS — R42 Dizziness and giddiness: Secondary | ICD-10-CM

## 2015-02-04 NOTE — Patient Instructions (Signed)
Medication Instructions:  Your physician recommends that you continue on your current medications as directed. Please refer to the Current Medication list given to you today.   Labwork: none  Testing/Procedures: none  Follow-Up: Your physician recommends that you schedule a follow-up appointment in: 2-3 months. Scheduled for April 27, 2015 at 9:30   Any Other Special Instructions Will Be Listed Below (If Applicable).

## 2015-02-04 NOTE — Progress Notes (Signed)
Chief Complaint  Patient presents with  . Dizziness    CAD    History of Present Illness: 54 yo male with history of CAD, HTN, HLD, GERD here today for cardiac follow up. He was admitted to [facility] 06/17/12 with a NSTEMI. LHC 06/17/12: mid LAD 90% and 80%, D2 99%, EF 55-60%. PCI: Promus Premier DES to the mid LAD and Promus Premier DES to the D2. He has not been treated with a beta blocker due to bradycardia. His Lisinopril was stopped March 2014 at visit with Tereso Newcomer, PA-C due to dizziness and hypotension. Exercise stress test May 2014 without ischemia. Since being seen in our office in 2015, he went to the ED in [facility] July 2016, was admitted and had a stress test which was reported as normal. He has had seven episodes of sudden awakening in the middle of the night with dizziness, nausea, SOB. He has had one episode of rapid HR and palpitations during an episode. Always while traveling. 4 episodes after 3 glasses of wine. He stopped atorvastatin secondary to muscle aches. He has been treated for anxiety by primary care. He was seen by Tereso Newcomer, PA-C 12/30/14. Echo 01/07/15 with normal LV function, dilated aortic root. Chest CTA 02/03/15 with dilated aortic root at 4.2 cm. Right lung nodule. 30 day event monitor in progress. Preliminary report shows sinus rhythm with no arrhythmias.   He is here today for follow up. No chest pain or SOB. He has been exercising. Still having some dizziness at night but none during the day. No exertional symptoms. He has not been known to have low blood sugars. He has noticed fewer symptoms since cutting back on his wine intake.   Primary Care Physician: Evelena Peat   Past Medical History  Diagnosis Date  . GERD (gastroesophageal reflux disease)   . CAD (coronary artery disease), native coronary artery 05/2012    a. s/p NSTEMI 2/14: LHC 06/17/12: Mid LAD 90% and 80%, D2 99%, EF 55-60%. PCI: Promus Premier DES to the mid LAD and Promus Premier DES to the  D2  . Hyperlipidemia   . Hypertension   . Sinus bradycardia   . Chicken pox   . History of echocardiogram     Echo 9/16:  EF 55-60%, normal diastolic fxn, mod dilatation of aortic sinuses, mild LAE    Past Surgical History  Procedure Laterality Date  . Appendectomy  1992  . Hernia repair  1999    inguinal  . Coronary angioplasty with stent placement  05/2012    90% then 80% mid LAD stenoses s/p DES x 1, 99% D2 lesion s/p DES x 1; LVEF 55-60%  . Left heart catheterization with coronary angiogram N/A 06/17/2012    Procedure: LEFT HEART CATHETERIZATION WITH CORONARY ANGIOGRAM;  Surgeon: Kathleene Hazel, MD;  Location: [facility] CATH LAB;  Service: Cardiovascular;  Laterality: N/A;  . Percutaneous coronary stent intervention (pci-s)  06/17/2012    Procedure: PERCUTANEOUS CORONARY STENT INTERVENTION (PCI-S);  Surgeon: Kathleene Hazel, MD;  Location: [facility] CATH LAB;  Service: Cardiovascular;;    Current Outpatient Prescriptions  Medication Sig Dispense Refill  . ALPRAZolam (XANAX) 0.5 MG tablet Take 1 tablet (0.5 mg total) by mouth 3 (three) times daily as needed for anxiety. 30 tablet 0  . aspirin EC 81 MG tablet Take 1 tablet (81 mg total) by mouth once.    Marland Kitchen buPROPion (WELLBUTRIN XL) 150 MG 24 hr tablet Take 1 tablet by mouth daily.    Marland Kitchen  lisinopril (PRINIVIL,ZESTRIL) 5 MG tablet Take 1 tablet (5 mg total) by mouth daily. (Patient taking differently: Take 5 mg by mouth daily as needed (for blood pressure). ) 30 tablet 5  . NITROSTAT 0.4 MG SL tablet PLACE 1 TAB UNDER THE TONGUE EVERY 5 MINUTES X3 DOSES AS NEEDED FOR CHEST PAIN 25 tablet 2  . rosuvastatin (CRESTOR) 10 MG tablet Take 1 tablet (10 mg total) by mouth daily. Monday, Wednesday and Friday only 30 tablet 3   No current facility-administered medications for this visit.    No Known Allergies  Social History   Social History  . Marital Status: Married    Spouse Name: N/A  . Number of Children: 2  . Years of Education:  N/A   Occupational History  .     Social History Main Topics  . Smoking status: Never Smoker   . Smokeless tobacco: Never Used  . Alcohol Use: 3.0 - 4.0 oz/week    6-8 drink(s) per week  . Drug Use: No  . Sexual Activity: Not on file   Other Topics Concern  . Not on file   Social History Narrative    Family History  Problem Relation Age of Onset  . CAD Father 38  . Heart attack Father   . Arthritis Father   . Cancer Father     prostate  . Heart disease Father   . Alcohol abuse Mother   . Hypertension Mother     Review of Systems:  As stated in the HPI and otherwise negative.   BP 102/70 mmHg  Pulse 68  Ht 5' 9.5" (1.765 m)  Wt 231 lb (104.781 kg)  BMI 33.64 kg/m2  SpO2 98%  Physical Examination: General: Well developed, well nourished, NAD HEENT: OP clear, mucus membranes moist SKIN: warm, dry. No rashes. Neuro: No focal deficits Musculoskeletal: Muscle strength 5/5 all ext Psychiatric: Mood and affect normal Neck: No JVD, no carotid bruits, no thyromegaly, no lymphadenopathy. Lungs:Clear bilaterally, no wheezes, rhonci, crackles Cardiovascular: Regular rate and rhythm. No murmurs, gallops or rubs. Abdomen:Soft. Bowel sounds present. Non-tender.  Extremities: No lower extremity edema. Pulses are 2 + in the bilateral DP/PT.  Echo 01/07/15: Left ventricle: The cavity size was normal. Wall thickness was normal. Systolic function was normal. The estimated ejection fraction was in the range of 55% to 60%. Left ventricular diastolic function parameters were normal. - Aorta: Moderate dilatation of the aortic sinuses Suggest cardiac CTA/ MRI to further assess. - Left atrium: The atrium was mildly dilated. - Atrial septum: No defect or patent foramen ovale was identified.  Chest CTA 02/03/15: Aneurysmal dilatation of the aortic root and ascending thoracic aorta. Mid ascending thoracic aorta measures up to 4.2 cm. Recommend annual imaging followup by CTA  or MRA. This recommendation follows 2010 ACCF/AHA/AATS/ACR/ASA/SCA/SCAI/SIR/STS/SVM Guidelines for the Diagnosis and Management of Patients with Thoracic Aortic Disease. Circulation. 2010; 121: Z610-R604  Coronary artery calcifications with coronary artery stents.  5 mm density along the right minor fissure could be focal thickening but indeterminate. If the patient is at high risk for bronchogenic carcinoma, follow-up chest CT at 6-12 months is recommended. If the patient is at low risk for bronchogenic carcinoma, follow-up chest CT at 12 months is recommended. This recommendation follows the consensus statement: Guidelines for Management of Small Pulmonary Nodules Detected on CT Scans: A Statement from the Fleischner Society as published in Radiology 2005;237:395-400.   EKG:  EKG is not ordered today. The ekg ordered today demonstrates   Recent  Labs: 12/30/2014: ALT 33; BUN 17; Creatinine, Ser 1.09; Hemoglobin 14.1; Platelets 248.0; Potassium 4.2; Pro B Natriuretic peptide (BNP) 16.0; Sodium 138   Lipid Panel    Component Value Date/Time   CHOL 258* 12/30/2014 1051   CHOL 165 12/07/2013 0859   TRIG 66.0 12/30/2014 1051   TRIG 70 12/07/2013 0859   HDL 61.80 12/30/2014 1051   HDL 60 12/07/2013 0859   CHOLHDL 4 12/30/2014 1051   VLDL 13.2 12/30/2014 1051   LDLCALC 183* 12/30/2014 1051   LDLCALC 91 12/07/2013 0859     Wt Readings from Last 3 Encounters:  02/04/15 231 lb (104.781 kg)  12/30/14 235 lb (106.595 kg)  12/24/14 233 lb (105.688 kg)     Other studies Reviewed: Additional studies/ records that were reviewed today include: . Review of the above records demonstrates:    Assessment and Plan:   1. CAD: He is stable. Stress test May 2014 without ischemia and [facility]. Continue ASA and Crestor. He does not tolerate beta blockers due to bradycardia  2. HTN: BP well controlled. No changes.   3.  Hyperlipidemia: Lipids are poorly controlled off of statin. He is now back on Crestor. Repeat lipids in December 2016.     4. Dizziness: Event monitor without any signfiicant arrhythmias. Isolated PVC. LVEF normal. This does not appear to be cardiac related. ? Related to blood sugars. To follow up in Primary care.   5. Thoracic aortic aneurysm: see CTA above. Will repeat CTA in April 2017.   6. Pulmonary nodule: noted on CT chest October 2016. Repeat CT in April 2017.   Current medicines are reviewed at length with the patient today.  The patient does not have concerns regarding medicines.  The following changes have been made:  no change  Labs/ tests ordered today include:  No orders of the defined types were placed in this encounter.    Disposition:   FU with me in 2 months  Signed, Verne Carrowhristopher Mikell Kazlauskas, MD 02/04/2015 9:11 AM    [facility] [address], [place], [place]  [id number] Phone: [phone]; Fax: [phone]

## 2015-02-09 ENCOUNTER — Encounter: Payer: Self-pay | Admitting: Cardiovascular Disease

## 2015-02-23 ENCOUNTER — Institutional Professional Consult (permissible substitution): Payer: BLUE CROSS/BLUE SHIELD | Admitting: Pulmonary Disease

## 2015-03-19 ENCOUNTER — Other Ambulatory Visit: Payer: Self-pay | Admitting: Cardiovascular Disease

## 2015-03-21 NOTE — Telephone Encounter (Signed)
Plavix was not on med list at last office visit.  I placed call to pt to see if he has been taking.  Mailbox full.  Unable to leave a message.

## 2015-03-21 NOTE — Telephone Encounter (Signed)
Just wanted to clarify that the patient should not be taking this. Per recent office visit he has completed course. Please advise. Thanks, MI

## 2015-03-22 NOTE — Telephone Encounter (Signed)
I reviewed with Dr. Clifton JamesMcAlhany and pt does not need to continue Plavix.  I spoke with pt who reports he stopped Plavix about 6 months ago on his own due to bruising.  He was not taking at time of last office visit her.  I told pt he did not need to continue Plavix.

## 2015-04-01 ENCOUNTER — Other Ambulatory Visit: Payer: BLUE CROSS/BLUE SHIELD

## 2015-04-26 NOTE — Progress Notes (Signed)
Chief Complaint  Patient presents with  . Follow-up    History of Present Illness: 55 yo male with history of CAD, HTN, HLD, GERD here today for cardiac follow up. [facility] with a NSTEMI. LHC 06/17/12: mid LAD 90% and 80%, D2 99%, EF 55-60%. PCI: Promus Premier DES to the mid LAD and Promus Premier DES to the D2. He has not been treated with a beta blocker due to bradycardia. His Lisinopril was stopped March 2014 at visit with Tereso NewcomerScott Weaver, PA-C due to dizziness and hypotension. Exercise stress test May 2014 without ischemia. Since being seen in our office in 2015, he went to the ED in [facility] July 2016, was admitted and had a stress test which was reported as normal. He has had seven episodes of sudden awakening in the middle of the night with dizziness, nausea, SOB. He has had one episode of rapid HR and palpitations during an episode. Always while traveling. 4 episodes after 3 glasses of wine. He stopped atorvastatin secondary to muscle aches. He has been treated for anxiety by primary care. He was seen by Tereso NewcomerScott Weaver, PA-C 12/30/14. Echo 01/07/15 with normal LV function, dilated aortic root. Chest CTA 02/03/15 with dilated aortic root at 4.2 cm. Right lung nodule. 30 day event monitor showed sinus rhythm with rare PVCs.    He is here today for follow up. No chest pain or SOB. He has been exercising. Dizziness resolved.    Primary Care Physician: Evelena PeatBruce Burchette   Past Medical History  Diagnosis Date  . GERD (gastroesophageal reflux disease)   . CAD (coronary artery disease), native coronary artery 05/2012    a. s/p NSTEMI 2/14: LHC 06/17/12: Mid LAD 90% and 80%, D2 99%, EF 55-60%. PCI: Promus Premier DES to the mid LAD and Promus Premier DES to the D2  . Hyperlipidemia   . Hypertension   . Sinus bradycardia   . Chicken pox   . History of echocardiogram     Echo 9/16:  EF 55-60%, normal diastolic fxn, mod dilatation of aortic sinuses, mild LAE    Past Surgical  History  Procedure Laterality Date  . Appendectomy  1992  . Hernia repair  1999    inguinal  . Coronary angioplasty with stent placement  05/2012    90% then 80% mid LAD stenoses s/p DES x 1, 99% D2 lesion s/p DES x 1; LVEF 55-60%  . Left heart catheterization with coronary angiogram N/A 06/17/2012    Procedure: LEFT HEART CATHETERIZATION WITH CORONARY ANGIOGRAM;  Surgeon: Kathleene Hazelhristopher D Diani Jillson, MD;  [facility];  Service: Cardiovascular;  Laterality: N/A;  . Percutaneous coronary stent intervention (pci-s)  06/17/2012    Procedure: PERCUTANEOUS CORONARY STENT INTERVENTION (PCI-S);  Surgeon: Kathleene Hazelhristopher D Jasyn Mey, MD;  Location[facility] LAB;  Service: Cardiovascular;;    Current Outpatient Prescriptions  Medication Sig Dispense Refill  . ALPRAZolam (XANAX) 0.5 MG tablet Take 1 tablet (0.5 mg total) by mouth 3 (three) times daily as needed for anxiety. 30 tablet 0  . aspirin 81 MG tablet Take 81 mg by mouth daily.    Marland Kitchen. buPROPion (WELLBUTRIN XL) 150 MG 24 hr tablet Take 1 tablet by mouth daily.    Marland Kitchen. NITROSTAT 0.4 MG SL tablet PLACE 1 TAB UNDER THE TONGUE EVERY 5 MINUTES X3 DOSES AS NEEDED FOR CHEST PAIN 25 tablet 2  . rosuvastatin (CRESTOR) 10 MG tablet Take one tablet by mouth 3 days per week. 15 tablet 11  No current facility-administered medications for this visit.    No Known Allergies  Social History   Social History  . Marital Status: Married    Spouse Name: N/A  . Number of Children: 2  . Years of Education: N/A   Occupational History  .     Social History Main Topics  . Smoking status: Never Smoker   . Smokeless tobacco: Never Used  . Alcohol Use: 3.0 - 4.0 oz/week    6-8 drink(s) per week  . Drug Use: No  . Sexual Activity: Not on file   Other Topics Concern  . Not on file   Social History Narrative    Family History  Problem Relation Age of Onset  . CAD Father 59  . Heart attack Father   . Arthritis Father   . Cancer Father     prostate  .  Heart disease Father   . Alcohol abuse Mother   . Hypertension Mother     Review of Systems:  As stated in the HPI and otherwise negative.   BP 120/86 mmHg  Pulse 77  Ht 5' 9.5" (1.765 m)  Wt 236 lb 6.4 oz (107.23 kg)  BMI 34.42 kg/m2  Physical Examination: General: Well developed, well nourished, NAD HEENT: OP clear, mucus membranes moist SKIN: warm, dry. No rashes. Neuro: No focal deficits Musculoskeletal: Muscle strength 5/5 all ext Psychiatric: Mood and affect normal Neck: No JVD, no carotid bruits, no thyromegaly, no lymphadenopathy. Lungs:Clear bilaterally, no wheezes, rhonci, crackles Cardiovascular: Regular rate and rhythm. No murmurs, gallops or rubs. Abdomen:Soft. Bowel sounds present. Non-tender.  Extremities: No lower extremity edema. Pulses are 2 + in the bilateral DP/PT.  Echo 01/07/15: Left ventricle: The cavity size was normal. Wall thickness was normal. Systolic function was normal. The estimated ejection fraction was in the range of 55% to 60%. Left ventricular diastolic function parameters were normal. - Aorta: Moderate dilatation of the aortic sinuses Suggest cardiac CTA/ MRI to further assess. - Left atrium: The atrium was mildly dilated. - Atrial septum: No defect or patent foramen ovale was identified.  Chest CTA 02/03/15: Aneurysmal dilatation of the aortic root and ascending thoracic aorta. Mid ascending thoracic aorta measures up to 4.2 cm. Recommend annual imaging followup by CTA or MRA. This recommendation follows 2010 ACCF/AHA/AATS/ACR/ASA/SCA/SCAI/SIR/STS/SVM Guidelines for the Diagnosis and Management of Patients with Thoracic Aortic Disease. Circulation. 2010; 121: Z610-R604  Coronary artery calcifications with coronary artery stents.  5 mm density along the right minor fissure could be focal thickening but indeterminate. If the patient is at high risk for bronchogenic carcinoma, follow-up chest CT at 6-12 months is recommended.  If the patient is at low risk for bronchogenic carcinoma, follow-up chest CT at 12 months is recommended. This recommendation follows the consensus statement: Guidelines for Management of Small Pulmonary Nodules Detected on CT Scans: A Statement from the Fleischner Society as published in Radiology 2005;237:395-400.   EKG:  EKG is ordered today. The ekg ordered today demonstrates NSR, rate Poor R wave progression.   Recent Labs: 12/30/2014: ALT 33; BUN 17; Creatinine, Ser 1.09; Hemoglobin 14.1; Platelets 248.0; Potassium 4.2; Pro B Natriuretic peptide (BNP) 16.0; Sodium 138   Lipid Panel    Component Value Date/Time   CHOL 258* 12/30/2014 1051   CHOL 165 12/07/2013 0859   TRIG 66.0 12/30/2014 1051   TRIG 70 12/07/2013 0859   HDL 61.80 12/30/2014 1051   HDL 60 12/07/2013 0859   CHOLHDL 4 12/30/2014 1051   VLDL 13.2 12/30/2014  1051   LDLCALC 183* 12/30/2014 1051   LDLCALC 91 12/07/2013 0859     Wt Readings from Last 3 Encounters:  04/27/15 236 lb 6.4 oz (107.23 kg)  02/04/15 231 lb (104.781 kg)  12/30/14 235 lb (106.595 kg)     Other studies Reviewed: Additional studies/ records that were reviewed today include: . Review of the above records demonstrates:    Assessment and Plan:   1. CAD: He is stable. Stress test May 2014 without ischemia and reported stress test from [facility] July 2016 with no ischemia. Continue ASA and Crestor. He does not tolerate beta blockers due to bradycardia  2. HTN: BP well controlled. No changes.   3. Hyperlipidemia: Lipids were poorly controlled off of statin. He will try Crestor 10 mg three times per week. Repeat lipids 3 months with LFTs.    4. Dizziness: Event monitor without any signfiicant arrhythmias. Isolated PVCs. LVEF normal. This does not appear to be cardiac related.  5. Thoracic aortic aneurysm: see CTA above. Will repeat CTA in October 2017.   6. Pulmonary nodule: noted on CT chest October 2016. Repeat CT in October 2017.    Current medicines are reviewed at length with the patient today.  The patient does not have concerns regarding medicines.  The following changes have been made:  no change  Labs/ tests ordered today include:   Orders Placed This Encounter  Procedures  . CT ANGIO CHEST AORTA W/CM &/OR WO/CM  . Lipid Profile  . Hepatic function panel  . Basic Metabolic Panel (BMET)  . EKG 12-Lead    Disposition:   FU with me in 2 months  Signed, Verne Carrow, MD 04/27/2015 11:08 AM    [facility] [address] [place], [place], [place]  [address] Phone: [phone]; Fax: [phone]

## 2015-04-27 ENCOUNTER — Ambulatory Visit (INDEPENDENT_AMBULATORY_CARE_PROVIDER_SITE_OTHER): Payer: BLUE CROSS/BLUE SHIELD | Admitting: Cardiovascular Disease

## 2015-04-27 ENCOUNTER — Encounter: Payer: Self-pay | Admitting: Cardiovascular Disease

## 2015-04-27 VITALS — BP 120/86 | HR 77 | Ht 69.5 in | Wt 236.4 lb

## 2015-04-27 DIAGNOSIS — I10 Essential (primary) hypertension: Secondary | ICD-10-CM

## 2015-04-27 DIAGNOSIS — I2510 Atherosclerotic heart disease of native coronary artery without angina pectoris: Secondary | ICD-10-CM

## 2015-04-27 DIAGNOSIS — I712 Thoracic aortic aneurysm, without rupture, unspecified: Secondary | ICD-10-CM

## 2015-04-27 DIAGNOSIS — R918 Other nonspecific abnormal finding of lung field: Secondary | ICD-10-CM

## 2015-04-27 DIAGNOSIS — E785 Hyperlipidemia, unspecified: Secondary | ICD-10-CM | POA: Diagnosis not present

## 2015-04-27 MED ORDER — ROSUVASTATIN CALCIUM 10 MG PO TABS: ORAL_TABLET | ORAL | Status: DC

## 2015-04-27 NOTE — Patient Instructions (Signed)
Medication Instructions:  Your physician has recommended you make the following change in your medication: Resume rosuvastatin 10 mg by mouth three days per week.   Labwork: Your physician recommends that you return for  Fasting lab work in 12 weeks.--Lipid and Liver profiles Your physician recommends that you return for lab work in: late September (prior to CT)--BMP    Testing/Procedures: Non-Cardiac CT Angiography (CTA), is a special type of CT scan that uses a computer to produce multi-dimensional views of major blood vessels throughout the body. In CT angiography, a contrast material is injected through an IV to help visualize the blood vessels.  To be done in October 2017  Follow-Up: Your physician wants you to follow-up in: 9 months after CT.  You will receive a reminder letter in the mail two months in advance. If you don't receive a letter, please call our office to schedule the follow-up appointment.   Any Other Special Instructions Will Be Listed Below (If Applicable).     If you need a refill on your cardiac medications before your next appointment, please call your pharmacy.

## 2015-06-09 ENCOUNTER — Ambulatory Visit (INDEPENDENT_AMBULATORY_CARE_PROVIDER_SITE_OTHER): Payer: BLUE CROSS/BLUE SHIELD | Admitting: Pulmonary Disease

## 2015-06-09 ENCOUNTER — Encounter: Payer: Self-pay | Admitting: Pulmonary Disease

## 2015-06-09 VITALS — BP 124/86 | HR 60 | Ht 69.5 in | Wt 235.0 lb

## 2015-06-09 DIAGNOSIS — G4733 Obstructive sleep apnea (adult) (pediatric): Secondary | ICD-10-CM

## 2015-06-09 DIAGNOSIS — Z9989 Dependence on other enabling machines and devices: Secondary | ICD-10-CM

## 2015-06-09 NOTE — Progress Notes (Signed)
   Subjective:    Patient ID: Joshua King, male    DOB: 30-Oct-1960, 55 y.o.   MRN: [id number]  HPI    Review of Systems  Constitutional: Negative for fever and unexpected weight change.  HENT: Negative for congestion, dental problem, ear pain, nosebleeds, postnasal drip, rhinorrhea, sinus pressure, sneezing, sore throat and trouble swallowing.   Eyes: Negative for redness and itching.  Respiratory: Negative for cough, chest tightness, shortness of breath and wheezing.   Cardiovascular: Negative for palpitations and leg swelling.  Gastrointestinal: Negative for nausea and vomiting.  Genitourinary: Negative for dysuria.  Musculoskeletal: Negative for joint swelling.  Skin: Negative for rash.  Neurological: Negative for headaches.  Hematological: Does not bruise/bleed easily.  Psychiatric/Behavioral: Negative for dysphoric mood. The patient is not nervous/anxious.        Objective:   Physical Exam        Assessment & Plan:

## 2015-06-09 NOTE — Patient Instructions (Signed)
Will arrange for new home care company for CPAP supplies Will get copy of CPAP download Will arrange for overnight oxygen with CPAP  Follow up in 3 months

## 2015-06-09 NOTE — Progress Notes (Signed)
Past Medical History He  has a past medical history of GERD (gastroesophageal reflux disease); CAD (coronary artery disease), native coronary artery (05/2012); Hyperlipidemia; Hypertension; Sinus bradycardia; Chicken pox; and History of echocardiogram.  Past Surgical History He  has past surgical history that includes Appendectomy (1992); Hernia repair (1999); Coronary angioplasty with stent (05/2012); left heart catheterization with coronary angiogram (N/A, 06/17/2012); and percutaneous coronary stent intervention (pci-s) (06/17/2012).  Current Outpatient Prescriptions on File Prior to Visit  Medication Sig  . aspirin 81 MG tablet Take 81 mg by mouth daily.  Marland Kitchen buPROPion (WELLBUTRIN XL) 150 MG 24 hr tablet Take 1 tablet by mouth daily.  Marland Kitchen NITROSTAT 0.4 MG SL tablet PLACE 1 TAB UNDER THE TONGUE EVERY 5 MINUTES X3 DOSES AS NEEDED FOR CHEST PAIN  . rosuvastatin (CRESTOR) 10 MG tablet Take one tablet by mouth 3 days per week.  . ALPRAZolam (XANAX) 0.5 MG tablet Take 1 tablet (0.5 mg total) by mouth 3 (three) times daily as needed for anxiety. (Patient not taking: Reported on 06/09/2015)   No current facility-administered medications on file prior to visit.    No Known Allergies  Family History His family history includes Alcohol abuse in his mother; Arthritis in his father; CAD (age of onset: 47) in his father; Cancer in his father; Heart attack in his father; Heart disease in his father; Hypertension in his mother.  Social History He  reports that he has never smoked. He has never used smokeless tobacco. He reports that he drinks about 3.6 - 4.8 oz of alcohol per week. He reports that he does not use illicit drugs.  Review of systems Review of Systems  Constitutional: Negative for fever and unexpected weight change.  HENT: Negative for congestion, dental problem, ear pain, nosebleeds, postnasal drip, rhinorrhea, sinus pressure, sneezing, sore throat and trouble swallowing.   Eyes: Negative for  redness and itching.  Respiratory: Negative for cough, chest tightness, shortness of breath and wheezing.   Cardiovascular: Negative for palpitations and leg swelling.  Gastrointestinal: Negative for nausea and vomiting.  Genitourinary: Negative for dysuria.  Musculoskeletal: Negative for joint swelling.  Skin: Negative for rash.  Neurological: Negative for headaches.  Hematological: Does not bruise/bleed easily.  Psychiatric/Behavioral: Negative for dysphoric mood. The patient is not nervous/anxious.     Chief Complaint  Patient presents with  . SLEEP CONSULT    Referred by Dr Caryl Never for OSA. HST 2013. Current CPAP 10. Reports Moderate/Heavy dizziness and slight throat tightness. DME: Sleep [facility] in [facility]. Epworth Score: 3   . CONSULT    Pt also reports being referred for spot on lung. Discuss flu vax    Tests: HST 06/07/11 >> AHI 19.6, SpO2 low 73%. Echo 01/07/15 >> EF 55 to 60%, mod aortic dilation CT angio chest 02/03/15 >> 4.2 cm ascending thoracic aortic dilation, 5 mm nodule along Rt minor fissure  Vital signs BP 124/86 mmHg  Pulse 60  Ht 5' 9.5" (1.765 m)  Wt 235 lb (106.595 kg)  BMI 34.22 kg/m2  SpO2 98%  History of Present Illness Joshua King is a 55 y.o. male for evaluation of sleep problems.  He has been followed by cardiology.  During this past summer he had several episodes of dizziness.  These episodes have been associated with feeling nauseous and short of breath.  He usually gets these episodes during the night.  It is unclear what trigger these episodes.  This has happened when he has travelled for work, but also when he  is at home.  He has tried adjusting his diet, ensuring fluid intake, cutting out alcohol but nothing seemed to click as far as a cause for these episodes.  He has not had an episode in the past several months, but is worried this could happen again.  He had holter monitoring which was negative for arrhythmia.  He had Echo and then CT  angio chest which showed aneurysmal dilation of ascending thoracic aorta, but no other findings that would explain his symptoms.  He was referred to determine if his sleep apnea is adequately controlled, and whether sleep disordered breathing could be contributing to his episodes.  He goes to bed at 9 pm.  He falls asleep after 10 minutes.  He wakes up at 5 am.  He will wake up 1 or 2 times during the night.  He is not using anything to help him sleep or stay awake.  He denies sleep walking, sleep talking, nightmares, or bruxism.  There is no history of restless leg syndrome.  He denies cataplexy, sleep paralysis, or sleep hallucinations.  His Epworth score is 3 out of 24.  He has been using CPAP 10 cm H2O and reports good compliance with CPAP.   Physical Exam:  General - No distress ENT - No sinus tenderness, no oral exudate, no LAN, no thyromegaly, TM clear, pupils equal/reactive Cardiac - s1s2 regular, no murmur, pulses symmetric Chest - No wheeze/rales/dullness, good air entry, normal respiratory excursion Back - No focal tenderness Abd - Soft, non-tender, no organomegaly, + bowel sounds Ext - No edema Neuro - Normal strength, cranial nerves intact Skin - No rashes Psych - Normal mood, and behavior  Discussion: He has unexplained episodes of dizziness.  I am not sure this is related to his sleep disordered breathing.  There doesn't seem to be an alternative pulmonary diagnosis that would explain these episodes.  Assessment/plan:  Obstructive sleep apnea. Plan: - will arrange for new DME for CPAP supplies - will get copy of his CPAP download and arrange for overnight oximetry with CPAP and room air  5 mm nodule along right minor fissure. Plan: - he will need f/u CT chest in October 2017 >> this will be monitored on f/u CT chest with cardiology for thoracic aortic dilation   Patient Instructions  Will arrange for new home care company for CPAP supplies Will get copy of  CPAP download Will arrange for overnight oxygen with CPAP  Follow up in 3 months     Coralyn Helling, M.D. Pager [phone]

## 2015-06-15 ENCOUNTER — Telehealth: Payer: Self-pay | Admitting: Pulmonary Disease

## 2015-06-15 DIAGNOSIS — G4733 Obstructive sleep apnea (adult) (pediatric): Secondary | ICD-10-CM

## 2015-06-15 NOTE — Telephone Encounter (Signed)
Order has been placed for new CPAP supplies. Nothing further needed

## 2015-07-04 ENCOUNTER — Other Ambulatory Visit: Payer: Self-pay

## 2015-07-04 DIAGNOSIS — E785 Hyperlipidemia, unspecified: Secondary | ICD-10-CM

## 2015-07-04 MED ORDER — ROSUVASTATIN CALCIUM 10 MG PO TABS: ORAL_TABLET | ORAL | Status: DC

## 2015-07-04 NOTE — Telephone Encounter (Signed)
[facility] [address] requesting 90 day supply for Crestor. Refill sent

## 2015-07-22 ENCOUNTER — Other Ambulatory Visit: Payer: BLUE CROSS/BLUE SHIELD

## 2015-07-26 ENCOUNTER — Other Ambulatory Visit (INDEPENDENT_AMBULATORY_CARE_PROVIDER_SITE_OTHER): Payer: BLUE CROSS/BLUE SHIELD | Admitting: *Deleted

## 2015-07-26 DIAGNOSIS — E785 Hyperlipidemia, unspecified: Secondary | ICD-10-CM | POA: Diagnosis not present

## 2015-07-26 LAB — HEPATIC FUNCTION PANEL
ALBUMIN: 4.4 g/dL (ref 3.6–5.1)
ALK PHOS: 38 U/L — AB (ref 40–115)
ALT: 21 U/L (ref 9–46)
AST: 20 U/L (ref 10–35)
Bilirubin, Direct: 0.2 mg/dL (ref <=0.2)
Indirect Bilirubin: 0.9 mg/dL (ref 0.2–1.2)
TOTAL PROTEIN: 6.6 g/dL (ref 6.1–8.1)
Total Bilirubin: 1.1 mg/dL (ref 0.2–1.2)

## 2015-07-26 LAB — LIPID PANEL
CHOLESTEROL: 172 mg/dL (ref 125–200)
HDL: 63 mg/dL (ref >=40)
LDL CALC: 102 mg/dL (ref <130)
TRIGLYCERIDES: 37 mg/dL (ref <150)
Total CHOL/HDL Ratio: 2.7 Ratio (ref <=5.0)
VLDL: 7 mg/dL (ref <30)

## 2015-07-28 ENCOUNTER — Other Ambulatory Visit: Payer: Self-pay | Admitting: *Deleted

## 2015-07-28 DIAGNOSIS — E785 Hyperlipidemia, unspecified: Secondary | ICD-10-CM

## 2015-07-28 MED ORDER — ROSUVASTATIN CALCIUM 10 MG PO TABS: 10.0000 mg | ORAL_TABLET | Freq: Every day | ORAL | Status: DC

## 2015-08-01 DIAGNOSIS — G4733 Obstructive sleep apnea (adult) (pediatric): Secondary | ICD-10-CM | POA: Diagnosis not present

## 2015-08-26 ENCOUNTER — Telehealth: Payer: Self-pay | Admitting: Pulmonary Disease

## 2015-08-26 NOTE — Telephone Encounter (Signed)
Left message for patient to call back  

## 2015-08-26 NOTE — Telephone Encounter (Signed)
Patient calling to see if we received his Sleep Study results. Checked Epic and it has been scanned into chart. Patient advised to bring SD card from CPAP to appointment on Monday so we can get download. Patient stated he would bring card. Nothing further needed.

## 2015-08-29 ENCOUNTER — Ambulatory Visit (INDEPENDENT_AMBULATORY_CARE_PROVIDER_SITE_OTHER): Payer: BLUE CROSS/BLUE SHIELD | Admitting: Pulmonary Disease

## 2015-08-29 ENCOUNTER — Encounter: Payer: Self-pay | Admitting: Pulmonary Disease

## 2015-08-29 VITALS — BP 128/66 | HR 60 | Ht 69.5 in | Wt 233.0 lb

## 2015-08-29 DIAGNOSIS — G4733 Obstructive sleep apnea (adult) (pediatric): Secondary | ICD-10-CM | POA: Diagnosis not present

## 2015-08-29 DIAGNOSIS — Z9989 Dependence on other enabling machines and devices: Secondary | ICD-10-CM

## 2015-08-29 NOTE — Patient Instructions (Signed)
Follow up in 1 year.

## 2015-08-29 NOTE — Progress Notes (Signed)
  Current Outpatient Prescriptions on File Prior to Visit  Medication Sig  . ALPRAZolam (XANAX) 0.5 MG tablet Take 1 tablet (0.5 mg total) by mouth 3 (three) times daily as needed for anxiety.  Marland Kitchen. aspirin 81 MG tablet Take 81 mg by mouth daily.  Marland Kitchen. buPROPion (WELLBUTRIN XL) 150 MG 24 hr tablet Take 1 tablet by mouth daily.  Marland Kitchen. NITROSTAT 0.4 MG SL tablet PLACE 1 TAB UNDER THE TONGUE EVERY 5 MINUTES X3 DOSES AS NEEDED FOR CHEST PAIN  . rosuvastatin (CRESTOR) 10 MG tablet Take 1 tablet (10 mg total) by mouth daily.   No current facility-administered medications on file prior to visit.    Chief Complaint  Patient presents with  . Follow-up    pt states he wear CPAP 7-8 hr nightly. feels pressure & mask are okay. no supplies needed. DME:[facility]    Tests: HST 06/07/11 >> AHI 19.6, SpO2 low 73%. Echo 01/07/15 >> EF 55 to 60%, mod aortic dilation CT angio chest 02/03/15 >> 4.2 cm ascending thoracic aortic dilation, 5 mm nodule along Rt minor fissure Auto CPAP 05/31/15 to 08/28/15 >> used on 89 of 90 nights with average 7 hrs 11 min.  Average AHI 1.6 with median CPAP 7 and 95 th percentile CPAP 10 cm H2O  Past medical history GERD, CAD, HLD, HTN  Past surgical history, Family history, Social history, Allergies reviewed  Vital signs BP 128/66 mmHg  Pulse 60  Ht 5' 9.5" (1.765 m)  Wt 233 lb (105.688 kg)  BMI 33.93 kg/m2  SpO2 98%  History of Present Illness Joshua King is a 55 y.o. male for evaluation of sleep problems.  He uses CPAP every night.  He has noticed feeling clenched in his jaw when he wakes up.  He wears a mouth guard for bruxism, and was recently seen by his dentist >> told no issues with this.  His CPAP mask fits well.  He denies allergies or sinus congestion.  Physical Exam:  General - No distress ENT - No sinus tenderness, no oral exudate, no LAN Cardiac - s1s2 regular, no murmur Chest - No wheeze/rales/dullness Back - No focal tenderness Abd - Soft, non-tender Ext - No  edema Neuro - Normal strength Skin - No rashes Psych - Normal mood, and behavior  Assessment/plan:  Obstructive sleep apnea. - continue auto CPAP  Jaw clenching. - not clear that this is related to his CPAP use - monitor clinically for now  5 mm nodule along right minor fissure. Plan: - he will need f/u CT chest in October 2017 >> this will be monitored on f/u CT chest with cardiology for thoracic aortic dilation   Patient Instructions  Follow up in 1 year     Coralyn HellingVineet Gino Garrabrant, M.D. Pager [phone] 08/29/2015, 9:34 AM

## 2015-09-23 DIAGNOSIS — L570 Actinic keratosis: Secondary | ICD-10-CM | POA: Diagnosis not present

## 2015-09-23 DIAGNOSIS — D225 Melanocytic nevi of trunk: Secondary | ICD-10-CM | POA: Diagnosis not present

## 2015-09-23 DIAGNOSIS — L820 Inflamed seborrheic keratosis: Secondary | ICD-10-CM | POA: Diagnosis not present

## 2015-09-23 DIAGNOSIS — L814 Other melanin hyperpigmentation: Secondary | ICD-10-CM | POA: Diagnosis not present

## 2015-10-17 ENCOUNTER — Other Ambulatory Visit (INDEPENDENT_AMBULATORY_CARE_PROVIDER_SITE_OTHER): Payer: BLUE CROSS/BLUE SHIELD | Admitting: *Deleted

## 2015-10-17 DIAGNOSIS — E785 Hyperlipidemia, unspecified: Secondary | ICD-10-CM

## 2015-10-17 LAB — HEPATIC FUNCTION PANEL
ALK PHOS: 36 U/L — AB (ref 40–115)
ALT: 24 U/L (ref 9–46)
AST: 21 U/L (ref 10–35)
Albumin: 4.4 g/dL (ref 3.6–5.1)
BILIRUBIN DIRECT: 0.1 mg/dL (ref <=0.2)
BILIRUBIN INDIRECT: 0.7 mg/dL (ref 0.2–1.2)
Total Bilirubin: 0.8 mg/dL (ref 0.2–1.2)
Total Protein: 6.6 g/dL (ref 6.1–8.1)

## 2015-10-17 LAB — LIPID PANEL
Cholesterol: 163 mg/dL (ref 125–200)
HDL: 72 mg/dL (ref >=40)
LDL CALC: 80 mg/dL (ref <130)
Total CHOL/HDL Ratio: 2.3 Ratio (ref <=5.0)
Triglycerides: 57 mg/dL (ref <150)
VLDL: 11 mg/dL (ref <30)

## 2015-10-17 NOTE — Addendum Note (Signed)
Addended by: Tonita PhoenixBOWDEN, ROBIN K on: 10/17/2015 08:58 AM   Modules accepted: Orders

## 2015-10-24 DIAGNOSIS — G4733 Obstructive sleep apnea (adult) (pediatric): Secondary | ICD-10-CM | POA: Diagnosis not present

## 2015-11-17 DIAGNOSIS — M1611 Unilateral primary osteoarthritis, right hip: Secondary | ICD-10-CM | POA: Diagnosis not present

## 2015-11-17 DIAGNOSIS — M7522 Bicipital tendinitis, left shoulder: Secondary | ICD-10-CM | POA: Diagnosis not present

## 2015-11-17 DIAGNOSIS — M7071 Other bursitis of hip, right hip: Secondary | ICD-10-CM | POA: Diagnosis not present

## 2015-11-17 DIAGNOSIS — M9905 Segmental and somatic dysfunction of pelvic region: Secondary | ICD-10-CM | POA: Diagnosis not present

## 2015-11-17 DIAGNOSIS — M9906 Segmental and somatic dysfunction of lower extremity: Secondary | ICD-10-CM | POA: Diagnosis not present

## 2015-11-17 DIAGNOSIS — M9903 Segmental and somatic dysfunction of lumbar region: Secondary | ICD-10-CM | POA: Diagnosis not present

## 2015-11-17 DIAGNOSIS — M9902 Segmental and somatic dysfunction of thoracic region: Secondary | ICD-10-CM | POA: Diagnosis not present

## 2015-11-17 DIAGNOSIS — M791 Myalgia: Secondary | ICD-10-CM | POA: Diagnosis not present

## 2015-11-21 DIAGNOSIS — M791 Myalgia: Secondary | ICD-10-CM | POA: Diagnosis not present

## 2015-11-21 DIAGNOSIS — M9903 Segmental and somatic dysfunction of lumbar region: Secondary | ICD-10-CM | POA: Diagnosis not present

## 2015-11-21 DIAGNOSIS — M7522 Bicipital tendinitis, left shoulder: Secondary | ICD-10-CM | POA: Diagnosis not present

## 2015-11-21 DIAGNOSIS — M1611 Unilateral primary osteoarthritis, right hip: Secondary | ICD-10-CM | POA: Diagnosis not present

## 2015-11-21 DIAGNOSIS — M7071 Other bursitis of hip, right hip: Secondary | ICD-10-CM | POA: Diagnosis not present

## 2015-11-21 DIAGNOSIS — M9905 Segmental and somatic dysfunction of pelvic region: Secondary | ICD-10-CM | POA: Diagnosis not present

## 2015-11-21 DIAGNOSIS — M9906 Segmental and somatic dysfunction of lower extremity: Secondary | ICD-10-CM | POA: Diagnosis not present

## 2015-11-21 DIAGNOSIS — M9902 Segmental and somatic dysfunction of thoracic region: Secondary | ICD-10-CM | POA: Diagnosis not present

## 2015-11-24 DIAGNOSIS — M9905 Segmental and somatic dysfunction of pelvic region: Secondary | ICD-10-CM | POA: Diagnosis not present

## 2015-11-24 DIAGNOSIS — M7071 Other bursitis of hip, right hip: Secondary | ICD-10-CM | POA: Diagnosis not present

## 2015-11-24 DIAGNOSIS — M7522 Bicipital tendinitis, left shoulder: Secondary | ICD-10-CM | POA: Diagnosis not present

## 2015-11-24 DIAGNOSIS — M791 Myalgia: Secondary | ICD-10-CM | POA: Diagnosis not present

## 2015-11-24 DIAGNOSIS — M9903 Segmental and somatic dysfunction of lumbar region: Secondary | ICD-10-CM | POA: Diagnosis not present

## 2015-11-24 DIAGNOSIS — M9902 Segmental and somatic dysfunction of thoracic region: Secondary | ICD-10-CM | POA: Diagnosis not present

## 2015-11-24 DIAGNOSIS — M9906 Segmental and somatic dysfunction of lower extremity: Secondary | ICD-10-CM | POA: Diagnosis not present

## 2015-11-24 DIAGNOSIS — M1611 Unilateral primary osteoarthritis, right hip: Secondary | ICD-10-CM | POA: Diagnosis not present

## 2015-12-03 DIAGNOSIS — M9906 Segmental and somatic dysfunction of lower extremity: Secondary | ICD-10-CM | POA: Diagnosis not present

## 2015-12-03 DIAGNOSIS — M9903 Segmental and somatic dysfunction of lumbar region: Secondary | ICD-10-CM | POA: Diagnosis not present

## 2015-12-03 DIAGNOSIS — M7071 Other bursitis of hip, right hip: Secondary | ICD-10-CM | POA: Diagnosis not present

## 2015-12-03 DIAGNOSIS — M9905 Segmental and somatic dysfunction of pelvic region: Secondary | ICD-10-CM | POA: Diagnosis not present

## 2015-12-03 DIAGNOSIS — M7522 Bicipital tendinitis, left shoulder: Secondary | ICD-10-CM | POA: Diagnosis not present

## 2015-12-03 DIAGNOSIS — M1611 Unilateral primary osteoarthritis, right hip: Secondary | ICD-10-CM | POA: Diagnosis not present

## 2015-12-03 DIAGNOSIS — M791 Myalgia: Secondary | ICD-10-CM | POA: Diagnosis not present

## 2015-12-03 DIAGNOSIS — M9902 Segmental and somatic dysfunction of thoracic region: Secondary | ICD-10-CM | POA: Diagnosis not present

## 2015-12-08 DIAGNOSIS — L720 Epidermal cyst: Secondary | ICD-10-CM | POA: Diagnosis not present

## 2015-12-08 DIAGNOSIS — L570 Actinic keratosis: Secondary | ICD-10-CM | POA: Diagnosis not present

## 2015-12-16 DIAGNOSIS — M1611 Unilateral primary osteoarthritis, right hip: Secondary | ICD-10-CM | POA: Diagnosis not present

## 2015-12-16 DIAGNOSIS — M9902 Segmental and somatic dysfunction of thoracic region: Secondary | ICD-10-CM | POA: Diagnosis not present

## 2015-12-16 DIAGNOSIS — M9903 Segmental and somatic dysfunction of lumbar region: Secondary | ICD-10-CM | POA: Diagnosis not present

## 2015-12-16 DIAGNOSIS — M9905 Segmental and somatic dysfunction of pelvic region: Secondary | ICD-10-CM | POA: Diagnosis not present

## 2015-12-16 DIAGNOSIS — M791 Myalgia: Secondary | ICD-10-CM | POA: Diagnosis not present

## 2015-12-16 DIAGNOSIS — M7522 Bicipital tendinitis, left shoulder: Secondary | ICD-10-CM | POA: Diagnosis not present

## 2015-12-16 DIAGNOSIS — M9906 Segmental and somatic dysfunction of lower extremity: Secondary | ICD-10-CM | POA: Diagnosis not present

## 2015-12-16 DIAGNOSIS — M7071 Other bursitis of hip, right hip: Secondary | ICD-10-CM | POA: Diagnosis not present

## 2015-12-23 DIAGNOSIS — M7071 Other bursitis of hip, right hip: Secondary | ICD-10-CM | POA: Diagnosis not present

## 2015-12-23 DIAGNOSIS — M1611 Unilateral primary osteoarthritis, right hip: Secondary | ICD-10-CM | POA: Diagnosis not present

## 2015-12-23 DIAGNOSIS — M9906 Segmental and somatic dysfunction of lower extremity: Secondary | ICD-10-CM | POA: Diagnosis not present

## 2015-12-23 DIAGNOSIS — M9905 Segmental and somatic dysfunction of pelvic region: Secondary | ICD-10-CM | POA: Diagnosis not present

## 2015-12-23 DIAGNOSIS — M7522 Bicipital tendinitis, left shoulder: Secondary | ICD-10-CM | POA: Diagnosis not present

## 2015-12-23 DIAGNOSIS — M9903 Segmental and somatic dysfunction of lumbar region: Secondary | ICD-10-CM | POA: Diagnosis not present

## 2015-12-23 DIAGNOSIS — M791 Myalgia: Secondary | ICD-10-CM | POA: Diagnosis not present

## 2015-12-23 DIAGNOSIS — M9902 Segmental and somatic dysfunction of thoracic region: Secondary | ICD-10-CM | POA: Diagnosis not present

## 2015-12-27 DIAGNOSIS — M9906 Segmental and somatic dysfunction of lower extremity: Secondary | ICD-10-CM | POA: Diagnosis not present

## 2015-12-27 DIAGNOSIS — M9903 Segmental and somatic dysfunction of lumbar region: Secondary | ICD-10-CM | POA: Diagnosis not present

## 2015-12-27 DIAGNOSIS — M9905 Segmental and somatic dysfunction of pelvic region: Secondary | ICD-10-CM | POA: Diagnosis not present

## 2015-12-27 DIAGNOSIS — M1611 Unilateral primary osteoarthritis, right hip: Secondary | ICD-10-CM | POA: Diagnosis not present

## 2015-12-27 DIAGNOSIS — M791 Myalgia: Secondary | ICD-10-CM | POA: Diagnosis not present

## 2015-12-27 DIAGNOSIS — M9902 Segmental and somatic dysfunction of thoracic region: Secondary | ICD-10-CM | POA: Diagnosis not present

## 2015-12-27 DIAGNOSIS — M7071 Other bursitis of hip, right hip: Secondary | ICD-10-CM | POA: Diagnosis not present

## 2015-12-27 DIAGNOSIS — M7522 Bicipital tendinitis, left shoulder: Secondary | ICD-10-CM | POA: Diagnosis not present

## 2015-12-31 DIAGNOSIS — M9905 Segmental and somatic dysfunction of pelvic region: Secondary | ICD-10-CM | POA: Diagnosis not present

## 2015-12-31 DIAGNOSIS — M791 Myalgia: Secondary | ICD-10-CM | POA: Diagnosis not present

## 2015-12-31 DIAGNOSIS — M7071 Other bursitis of hip, right hip: Secondary | ICD-10-CM | POA: Diagnosis not present

## 2015-12-31 DIAGNOSIS — M9906 Segmental and somatic dysfunction of lower extremity: Secondary | ICD-10-CM | POA: Diagnosis not present

## 2015-12-31 DIAGNOSIS — M7522 Bicipital tendinitis, left shoulder: Secondary | ICD-10-CM | POA: Diagnosis not present

## 2015-12-31 DIAGNOSIS — M1611 Unilateral primary osteoarthritis, right hip: Secondary | ICD-10-CM | POA: Diagnosis not present

## 2015-12-31 DIAGNOSIS — M9902 Segmental and somatic dysfunction of thoracic region: Secondary | ICD-10-CM | POA: Diagnosis not present

## 2016-01-02 DIAGNOSIS — M9905 Segmental and somatic dysfunction of pelvic region: Secondary | ICD-10-CM | POA: Diagnosis not present

## 2016-01-02 DIAGNOSIS — M1611 Unilateral primary osteoarthritis, right hip: Secondary | ICD-10-CM | POA: Diagnosis not present

## 2016-01-02 DIAGNOSIS — M9903 Segmental and somatic dysfunction of lumbar region: Secondary | ICD-10-CM | POA: Diagnosis not present

## 2016-01-02 DIAGNOSIS — M9906 Segmental and somatic dysfunction of lower extremity: Secondary | ICD-10-CM | POA: Diagnosis not present

## 2016-01-02 DIAGNOSIS — M7071 Other bursitis of hip, right hip: Secondary | ICD-10-CM | POA: Diagnosis not present

## 2016-01-02 DIAGNOSIS — M791 Myalgia: Secondary | ICD-10-CM | POA: Diagnosis not present

## 2016-01-02 DIAGNOSIS — M7522 Bicipital tendinitis, left shoulder: Secondary | ICD-10-CM | POA: Diagnosis not present

## 2016-01-02 DIAGNOSIS — M9902 Segmental and somatic dysfunction of thoracic region: Secondary | ICD-10-CM | POA: Diagnosis not present

## 2016-01-05 DIAGNOSIS — M9906 Segmental and somatic dysfunction of lower extremity: Secondary | ICD-10-CM | POA: Diagnosis not present

## 2016-01-05 DIAGNOSIS — M7522 Bicipital tendinitis, left shoulder: Secondary | ICD-10-CM | POA: Diagnosis not present

## 2016-01-05 DIAGNOSIS — M9902 Segmental and somatic dysfunction of thoracic region: Secondary | ICD-10-CM | POA: Diagnosis not present

## 2016-01-05 DIAGNOSIS — M1611 Unilateral primary osteoarthritis, right hip: Secondary | ICD-10-CM | POA: Diagnosis not present

## 2016-01-05 DIAGNOSIS — M791 Myalgia: Secondary | ICD-10-CM | POA: Diagnosis not present

## 2016-01-05 DIAGNOSIS — M7071 Other bursitis of hip, right hip: Secondary | ICD-10-CM | POA: Diagnosis not present

## 2016-01-05 DIAGNOSIS — M9903 Segmental and somatic dysfunction of lumbar region: Secondary | ICD-10-CM | POA: Diagnosis not present

## 2016-01-05 DIAGNOSIS — M9905 Segmental and somatic dysfunction of pelvic region: Secondary | ICD-10-CM | POA: Diagnosis not present

## 2016-01-18 ENCOUNTER — Other Ambulatory Visit: Payer: BLUE CROSS/BLUE SHIELD

## 2016-01-24 ENCOUNTER — Ambulatory Visit (INDEPENDENT_AMBULATORY_CARE_PROVIDER_SITE_OTHER)
Admission: RE | Admit: 2016-01-24 | Discharge: 2016-01-24 | Disposition: A | Discharge status: Home / Self Care | Payer: BLUE CROSS/BLUE SHIELD | Source: Ambulatory Visit | Attending: Cardiovascular Disease | Admitting: Cardiovascular Disease

## 2016-01-24 DIAGNOSIS — I712 Thoracic aortic aneurysm, without rupture, unspecified: Secondary | ICD-10-CM

## 2016-01-24 MED ORDER — IOPAMIDOL (ISOVUE-370) INJECTION 76%
100.0000 mL | Freq: Once | INTRAVENOUS | Status: AC | PRN
  Administered 2016-01-24: 100 mL via INTRAVENOUS

## 2016-01-31 DIAGNOSIS — M9903 Segmental and somatic dysfunction of lumbar region: Secondary | ICD-10-CM | POA: Diagnosis not present

## 2016-01-31 DIAGNOSIS — M791 Myalgia: Secondary | ICD-10-CM | POA: Diagnosis not present

## 2016-01-31 DIAGNOSIS — M9906 Segmental and somatic dysfunction of lower extremity: Secondary | ICD-10-CM | POA: Diagnosis not present

## 2016-01-31 DIAGNOSIS — M7071 Other bursitis of hip, right hip: Secondary | ICD-10-CM | POA: Diagnosis not present

## 2016-01-31 DIAGNOSIS — M9905 Segmental and somatic dysfunction of pelvic region: Secondary | ICD-10-CM | POA: Diagnosis not present

## 2016-01-31 DIAGNOSIS — M9902 Segmental and somatic dysfunction of thoracic region: Secondary | ICD-10-CM | POA: Diagnosis not present

## 2016-01-31 DIAGNOSIS — M1611 Unilateral primary osteoarthritis, right hip: Secondary | ICD-10-CM | POA: Diagnosis not present

## 2016-01-31 DIAGNOSIS — M7522 Bicipital tendinitis, left shoulder: Secondary | ICD-10-CM | POA: Diagnosis not present

## 2016-02-12 DIAGNOSIS — J029 Acute pharyngitis, unspecified: Secondary | ICD-10-CM | POA: Diagnosis not present

## 2016-02-13 ENCOUNTER — Ambulatory Visit: Payer: BLUE CROSS/BLUE SHIELD | Admitting: Cardiovascular Disease

## 2016-02-13 NOTE — Progress Notes (Deleted)
No chief complaint on file.   History of Present Illness: 55 yo male with history of CAD, HTN, HLD, GERD here today for cardiac follow up. He was admitted to [facility] 06/17/12 with a NSTEMI. LHC 06/17/12: mid LAD 90% and 80%, D2 99%, EF 55-60%. PCI: Promus Premier DES to the mid LAD and Promus Premier DES to the D2. He has not been treated with a beta blocker due to bradycardia. His Lisinopril was stopped March 2014 at visit with Tereso Newcomer, PA-C due to dizziness and hypotension. Exercise stress test May 2014 without ischemia. Since being seen in our office in 2015, he went to the ED in [facility] July 2016, was admitted and had a stress test which was reported as normal. He has had seven episodes of sudden awakening in the middle of the night with dizziness, nausea, SOB. He has had one episode of rapid HR and palpitations during an episode. Always while traveling. 4 episodes after 3 glasses of wine. He stopped atorvastatin secondary to muscle aches. He has been treated for anxiety by primary care. He was seen by Tereso Newcomer, PA-C 12/30/14. Echo 01/07/15 with normal LV function, dilated aortic root. Chest CTA 02/03/15 with dilated aortic root at 4.2 cm. Right lung nodule. 30 day event monitor showed sinus rhythm with rare PVCs.    He is here today for follow up. No chest pain or SOB. He has been exercising. Dizziness resolved.    Primary Care Physician: Kristian Covey, MD   Past Medical History:  Diagnosis Date  . CAD (coronary artery disease), native coronary artery 05/2012   a. s/p NSTEMI 2/14: LHC 06/17/12: Mid LAD 90% and 80%, D2 99%, EF 55-60%. PCI: Promus Premier DES to the mid LAD and Promus Premier DES to the D2  . Chicken pox   . GERD (gastroesophageal reflux disease)   . History of echocardiogram    Echo 9/16:  EF 55-60%, normal diastolic fxn, mod dilatation of aortic sinuses, mild LAE  . Hyperlipidemia   . Hypertension   . Sinus bradycardia     Past Surgical History:  Procedure  Laterality Date  . APPENDECTOMY  1992  . CORONARY ANGIOPLASTY WITH STENT PLACEMENT  05/2012   90% then 80% mid LAD stenoses s/p DES x 1, 99% D2 lesion s/p DES x 1; LVEF 55-60%  . HERNIA REPAIR  1999   inguinal  . LEFT HEART CATHETERIZATION WITH CORONARY ANGIOGRAM N/A 06/17/2012   Procedure: LEFT HEART CATHETERIZATION WITH CORONARY ANGIOGRAM;  Surgeon: Kathleene Hazel, MD;  Location: [facility] CATH LAB;  Service: Cardiovascular;  Laterality: N/A;  . PERCUTANEOUS CORONARY STENT INTERVENTION (PCI-S)  06/17/2012   Procedure: PERCUTANEOUS CORONARY STENT INTERVENTION (PCI-S);  Surgeon: Kathleene Hazel, MD;  Location: [facility] CATH LAB;  Service: Cardiovascular;;    Current Outpatient Prescriptions  Medication Sig Dispense Refill  . ALPRAZolam (XANAX) 0.5 MG tablet Take 1 tablet (0.5 mg total) by mouth 3 (three) times daily as needed for anxiety. 30 tablet 0  . aspirin 81 MG tablet Take 81 mg by mouth daily.    Marland Kitchen buPROPion (WELLBUTRIN XL) 150 MG 24 hr tablet Take 1 tablet by mouth daily.    Marland Kitchen NITROSTAT 0.4 MG SL tablet PLACE 1 TAB UNDER THE TONGUE EVERY 5 MINUTES X3 DOSES AS NEEDED FOR CHEST PAIN 25 tablet 2  . rosuvastatin (CRESTOR) 10 MG tablet Take 1 tablet (10 mg total) by mouth daily. 90 tablet 3   No current facility-administered medications for this visit.  No Known Allergies  Social History   Social History  . Marital status: Married    Spouse name: N/A  . Number of children: 2  . Years of education: N/A   Occupational History  . Business Manager/sales [employer]   Social History Main Topics  . Smoking status: Never Smoker  . Smokeless tobacco: Never Used  . Alcohol use 3.6 - 4.8 oz/week    6 - 8 Standard drinks or equivalent per week  . Drug use: No  . Sexual activity: Not on file   Other Topics Concern  . Not on file   Social History Narrative  . No narrative on file    Family History  Problem Relation Age of Onset  . CAD Father [id number]  . Heart attack  Father   . Arthritis Father   . Cancer Father     prostate  . Heart disease Father   . Alcohol abuse Mother   . Hypertension Mother     Review of Systems:  As stated in the HPI and otherwise negative.   There were no vitals taken for this visit.  Physical Examination: General: Well developed, well nourished, NAD  HEENT: OP clear, mucus membranes moist  SKIN: warm, dry. No rashes. Neuro: No focal deficits  Musculoskeletal: Muscle strength 5/5 all ext  Psychiatric: Mood and affect normal  Neck: No JVD, no carotid bruits, no thyromegaly, no lymphadenopathy.  Lungs:Clear bilaterally, no wheezes, rhonci, crackles Cardiovascular: Regular rate and rhythm. No murmurs, gallops or rubs. Abdomen:Soft. Bowel sounds present. Non-tender.  Extremities: No lower extremity edema. Pulses are 2 + in the bilateral DP/PT.  Echo 01/07/15: Left ventricle: The cavity size was normal. Wall thickness was normal. Systolic function was normal. The estimated ejection fraction was in the range of 55% to 60%. Left ventricular diastolic function parameters were normal. - Aorta: Moderate dilatation of the aortic sinuses Suggest cardiac CTA/ MRI to further assess. - Left atrium: The atrium was mildly dilated. - Atrial septum: No defect or patent foramen ovale was identified.  Chest CTA 02/03/15: Aneurysmal dilatation of the aortic root and ascending thoracic aorta. Mid ascending thoracic aorta measures up to 4.2 cm. Recommend annual imaging followup by CTA or MRA. This recommendation follows 2010 ACCF/AHA/AATS/ACR/ASA/SCA/SCAI/SIR/STS/SVM Guidelines for the Diagnosis and Management of Patients with Thoracic Aortic Disease. Circulation. 2010; 121: Z610-R604: [id number]  Coronary artery calcifications with coronary artery stents.  5 mm density along the right minor fissure could be focal thickening but indeterminate. If the patient is at high risk for bronchogenic carcinoma, follow-up chest CT at 6-12  months is recommended. If the patient is at low risk for bronchogenic carcinoma, follow-up chest CT at 12 months is recommended. This recommendation follows the consensus statement: Guidelines for Management of Small Pulmonary Nodules Detected on CT Scans: A Statement from the Fleischner Society as published in Radiology 2005;237:395-400.   EKG:  EKG is *** ordered today. The ekg ordered today demonstrates   Recent Labs: 10/17/2015: ALT 24   Lipid Panel    Component Value Date/Time   CHOL 163 10/17/2015 0858   CHOL 165 12/07/2013 0859   TRIG 57 10/17/2015 0858   TRIG 70 12/07/2013 0859   HDL 72 10/17/2015 0858   HDL 60 12/07/2013 0859   CHOLHDL 2.3 10/17/2015 0858   VLDL 11 10/17/2015 0858   LDLCALC 80 10/17/2015 0858   LDLCALC 91 12/07/2013 0859     Wt Readings from Last 3 Encounters:  08/29/15 233 lb (105.7 kg)  06/09/15 235  lb (106.6 kg)  04/27/15 236 lb 6.4 oz (107.2 kg)     Other studies Reviewed: Additional studies/ records that were reviewed today include: . Review of the above records demonstrates:    Assessment and Plan:   1. CAD: He is stable. Stress test May 2014 without ischemia and reported stress test from [facility] July 2016 with no ischemia. Continue ASA and Crestor. He does not tolerate beta blockers due to bradycardia  2. HTN: BP well controlled. No changes.   3. Hyperlipidemia: Lipids were poorly controlled off of statin. LDL now near goal on Crestor 10 mg daily. ***    4. Dizziness: Event monitor without any signfiicant arrhythmias. Isolated PVCs. LVEF normal. This does not appear to be cardiac related.  5. Thoracic aortic aneurysm: Stable at 4.4 cm by CTA October 2017. Will repeat CTA in October 2018.   6. Pulmonary nodule: noted on CT chest October 2016, stable by CT in October 2017. Follow with CT October 2018.  Current medicines are reviewed at length with the patient today.  The patient does not have concerns regarding medicines.  The  following changes have been made:  no change  Labs/ tests ordered today include:   No orders of the defined types were placed in this encounter.   Disposition:   FU with me in 2 months  Signed, Verne Carrow, MD 02/13/2016 9:36 AM    [facility] [address] [place], [place], [place]  [address] Phone: [phone]; Fax: [phone]

## 2016-04-18 ENCOUNTER — Encounter: Payer: Self-pay | Admitting: Cardiovascular Disease

## 2016-04-24 DIAGNOSIS — M9903 Segmental and somatic dysfunction of lumbar region: Secondary | ICD-10-CM | POA: Diagnosis not present

## 2016-04-24 DIAGNOSIS — M9902 Segmental and somatic dysfunction of thoracic region: Secondary | ICD-10-CM | POA: Diagnosis not present

## 2016-04-24 DIAGNOSIS — M9906 Segmental and somatic dysfunction of lower extremity: Secondary | ICD-10-CM | POA: Diagnosis not present

## 2016-04-24 DIAGNOSIS — M9905 Segmental and somatic dysfunction of pelvic region: Secondary | ICD-10-CM | POA: Diagnosis not present

## 2016-04-26 ENCOUNTER — Ambulatory Visit: Payer: BLUE CROSS/BLUE SHIELD | Admitting: Cardiovascular Disease

## 2016-04-27 ENCOUNTER — Ambulatory Visit (INDEPENDENT_AMBULATORY_CARE_PROVIDER_SITE_OTHER): Payer: BLUE CROSS/BLUE SHIELD | Admitting: Cardiovascular Disease

## 2016-04-27 ENCOUNTER — Encounter: Payer: Self-pay | Admitting: Cardiovascular Disease

## 2016-04-27 VITALS — BP 150/90 | HR 55 | Ht 69.5 in | Wt 240.4 lb

## 2016-04-27 DIAGNOSIS — E7800 Pure hypercholesterolemia, unspecified: Secondary | ICD-10-CM | POA: Diagnosis not present

## 2016-04-27 DIAGNOSIS — I712 Thoracic aortic aneurysm, without rupture, unspecified: Secondary | ICD-10-CM

## 2016-04-27 DIAGNOSIS — I2510 Atherosclerotic heart disease of native coronary artery without angina pectoris: Secondary | ICD-10-CM | POA: Diagnosis not present

## 2016-04-27 DIAGNOSIS — I10 Essential (primary) hypertension: Secondary | ICD-10-CM

## 2016-04-27 DIAGNOSIS — R918 Other nonspecific abnormal finding of lung field: Secondary | ICD-10-CM

## 2016-04-27 NOTE — Patient Instructions (Signed)
Medication Instructions:  Your physician recommends that you continue on your current medications as directed. Please refer to the Current Medication list given to you today.   Labwork: none  Testing/Procedures: Non-Cardiac CT scanning, (CAT scanning), is a noninvasive, special x-ray that produces cross-sectional images of the body using x-rays and a computer. CT scans help physicians diagnose and treat medical conditions. For some CT exams, a contrast material is used to enhance visibility in the area of the body being studied. CT scans provide greater clarity and reveal more details than regular x-ray exams. To be done in October 2018    Follow-Up: Your physician recommends that you schedule a follow-up appointment in: 12 months. Please call our office in September to schedule this appointment.     Any Other Special Instructions Will Be Listed Below (If Applicable).     If you need a refill on your cardiac medications before your next appointment, please call your pharmacy.

## 2016-04-27 NOTE — Addendum Note (Signed)
Addended by: Awilda BillARDEN, Nasra Counce J on: 04/27/2016 02:47 PM   Modules accepted: Orders

## 2016-04-27 NOTE — Progress Notes (Signed)
Chief Complaint  Patient presents with  . 9 month f/u    History of Present Illness: 56 yo male with history of CAD, HTN, HLD, GERD, OSA on CPAP here today for cardiac follow up. He was admitted to [facility] in 2014 with a NSTEMI. Cardiac cath with severe stenosis mid LAD and second Diagonal. Drug eluting stents were placed in the LAD and diagonal branch. He has not been treated with a beta blocker due to bradycardia. His Lisinopril was stopped due to hypotension. Exercise stress test in our office May 2014 without ischemia. In 2016, he went to the ED in [place] with chest pain, was admitted and had a stress test which was reported as normal. Echo 01/07/15 with normal LV function, dilated aortic root. Chest CTA October 2017 with stable mildly dilated ascending aorta (4.1 cm). Right lung nodule noted in 2016, stable October 2017. 30 day event monitor showed sinus rhythm with rare PVCs.    He is here today for follow up. No chest pain or SOB. He has not been exercising.  He feels well overall.   Primary Care Physician: Kristian Covey, MD   Past Medical History:  Diagnosis Date  . CAD (coronary artery disease), native coronary artery 05/2012   a. s/p NSTEMI 2/14: LHC 06/17/12: Mid LAD 90% and 80%, D2 99%, EF 55-60%. PCI: Promus Premier DES to the mid LAD and Promus Premier DES to the D2  . Chicken pox   . GERD (gastroesophageal reflux disease)   . History of echocardiogram    Echo 9/16:  EF 55-60%, normal diastolic fxn, mod dilatation of aortic sinuses, mild LAE  . Hyperlipidemia   . Hypertension   . Sinus bradycardia     Past Surgical History:  Procedure Laterality Date  . APPENDECTOMY  1992  . CORONARY ANGIOPLASTY WITH STENT PLACEMENT  05/2012   90% then 80% mid LAD stenoses s/p DES x 1, 99% D2 lesion s/p DES x 1; LVEF 55-60%  . HERNIA REPAIR  1999   inguinal  . LEFT HEART CATHETERIZATION WITH CORONARY ANGIOGRAM N/A 06/17/2012   Procedure: LEFT HEART CATHETERIZATION WITH CORONARY  ANGIOGRAM;  Surgeon: Kathleene Hazel, MD;  Location: [facility] CATH LAB;  Service: Cardiovascular;  Laterality: N/A;  . PERCUTANEOUS CORONARY STENT INTERVENTION (PCI-S)  06/17/2012   Procedure: PERCUTANEOUS CORONARY STENT INTERVENTION (PCI-S);  Surgeon: Kathleene Hazel, MD;  Location: [facility] CATH LAB;  Service: Cardiovascular;;    Current Outpatient Prescriptions  Medication Sig Dispense Refill  . ALPRAZolam (XANAX) 0.5 MG tablet Take 1 tablet (0.5 mg total) by mouth 3 (three) times daily as needed for anxiety. 30 tablet 0  . aspirin 81 MG tablet Take 81 mg by mouth daily.    Marland Kitchen buPROPion (WELLBUTRIN XL) 150 MG 24 hr tablet Take 1 tablet by mouth daily.    Marland Kitchen NITROSTAT 0.4 MG SL tablet PLACE 1 TAB UNDER THE TONGUE EVERY 5 MINUTES X3 DOSES AS NEEDED FOR CHEST PAIN 25 tablet 2  . rosuvastatin (CRESTOR) 10 MG tablet Take 1 tablet (10 mg total) by mouth daily. 90 tablet 3   No current facility-administered medications for this visit.     No Known Allergies  Social History   Social History  . Marital status: Married    Spouse name: N/A  . Number of children: 2  . Years of education: N/A   Occupational History  . 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 Smoking status: Never Smoker  . Smokeless  tobacco: Never Used  . Alcohol use 3.6 - 4.8 oz/week    6 - 8 Standard drinks or equivalent per week  . Drug use: No  . Sexual activity: Not on file   Other Topics Concern  . Not on file   Social History Narrative  . No narrative on file    Family History  Problem Relation Age of Onset  . CAD Father 24  . Heart attack Father   . Arthritis Father   . Cancer Father     prostate  . Heart disease Father   . Alcohol abuse Mother   . Hypertension Mother     Review of Systems:  As stated in the HPI and otherwise negative.   BP (!) 150/90   Pulse (!) 55   Ht 5' 9.5" (1.765 m)   Wt 240 lb 6.4 oz (109 kg)   SpO2 98%   BMI 34.99 kg/m   Physical  Examination: General: Well developed, well nourished, NAD  HEENT: OP clear, mucus membranes moist  SKIN: warm, dry. No rashes. Neuro: No focal deficits  Musculoskeletal: Muscle strength 5/5 all ext  Psychiatric: Mood and affect normal  Neck: No JVD, no carotid bruits, no thyromegaly, no lymphadenopathy.  Lungs:Clear bilaterally, no wheezes, rhonci, crackles Cardiovascular: Regular rate and rhythm. No murmurs, gallops or rubs. Abdomen:Soft. Bowel sounds present. Non-tender.  Extremities: No lower extremity edema. Pulses are 2 + in the bilateral DP/PT.  Echo 01/07/15: Left ventricle: The cavity size was normal. Wall thickness was normal. Systolic function was normal. The estimated ejection fraction was in the range of 55% to 60%. Left ventricular diastolic function parameters were normal. - Aorta: Moderate dilatation of the aortic sinuses Suggest cardiac CTA/ MRI to further assess. - Left atrium: The atrium was mildly dilated. - Atrial septum: No defect or patent foramen ovale was identified.  Chest CTA 02/03/15: Aneurysmal dilatation of the aortic root and ascending thoracic aorta. Mid ascending thoracic aorta measures up to 4.2 cm. Recommend annual imaging followup by CTA or MRA. This recommendation follows 2010 ACCF/AHA/AATS/ACR/ASA/SCA/SCAI/SIR/STS/SVM Guidelines for the Diagnosis and Management of Patients with Thoracic Aortic Disease. Circulation. 2010; 121: Z610-R604 Coronary artery calcifications with coronary artery stents. 5 mm density along the right minor fissure could be focal thickening but indeterminate. If the patient is at high risk for bronchogenic carcinoma, follow-up chest CT at 6-12 months is recommended. If the patient is at low risk for bronchogenic carcinoma, follow-up chest CT at 12 months is recommended. This recommendation follows the consensus statement: Guidelines for Management of Small Pulmonary Nodules Detected on CT Scans: A Statement from  the Fleischner Society as published in Radiology 2005;237:395-400.  EKG:  EKG is ordered today. The ekg ordered today demonstrates NSR, rate 60 bpm  Recent Labs: 10/17/2015: ALT 24   Lipid Panel    Component Value Date/Time   CHOL 163 10/17/2015 0858   CHOL 165 12/07/2013 0859   TRIG 57 10/17/2015 0858   TRIG 70 12/07/2013 0859   HDL 72 10/17/2015 0858   HDL 60 12/07/2013 0859   CHOLHDL 2.3 10/17/2015 0858   VLDL 11 10/17/2015 0858   LDLCALC 80 10/17/2015 0858   LDLCALC 91 12/07/2013 0859     Wt Readings from Last 3 Encounters:  04/27/16 240 lb 6.4 oz (109 kg)  08/29/15 233 lb (105.7 kg)  06/09/15 235 lb (106.6 kg)     Other studies Reviewed: Additional studies/ records that were reviewed today include: . Review of the above records demonstrates:  Assessment and Plan:   1. CAD without angina: No recent chest pain suggestive of ischemia. Stress test May 2014 without ischemia and [facility] with [facility]. Continue ASA and Crestor. He does not tolerate beta blockers due to bradycardia  2. HTN: BP is elevated today but has been well controlled at home. No changes.   3. Hyperlipidemia: Tolerating low dose Crestor.  Last LDL 113 at work in November 2017. (scanned in). He will attempt to increase exercise, weight loss and optimize diet.   4. Thoracic aortic aneurysm: Mild dilatation, 4.1 cm at the mid ascending thoracic aorta by CTA October 2017. Repeat October 2018 to reassess pulm nodule.    6. Pulmonary nodule: noted on CT chest October 2016. Stable October 2017. Repeat CT in October 2018.   Current medicines are reviewed at length with the patient today.  The patient does not have concerns regarding medicines.  The following changes have been made:  no change  Labs/ tests ordered today include:   Orders Placed This Encounter  Procedures  . CT ANGIO CHEST AORTA W &/OR WO CONTRAST    Disposition:   FU with me in 2  months  Signed, Verne Carrowhristopher Jaidon Ellery, MD 04/27/2016 10:37 AM    [facility] [address], [place], [place]  [address] Phone: [phone]; Fax: [phone]

## 2016-08-09 DIAGNOSIS — M9906 Segmental and somatic dysfunction of lower extremity: Secondary | ICD-10-CM | POA: Diagnosis not present

## 2016-08-09 DIAGNOSIS — M9903 Segmental and somatic dysfunction of lumbar region: Secondary | ICD-10-CM | POA: Diagnosis not present

## 2016-08-09 DIAGNOSIS — M9905 Segmental and somatic dysfunction of pelvic region: Secondary | ICD-10-CM | POA: Diagnosis not present

## 2016-08-09 DIAGNOSIS — M9902 Segmental and somatic dysfunction of thoracic region: Secondary | ICD-10-CM | POA: Diagnosis not present

## 2016-08-15 DIAGNOSIS — M9902 Segmental and somatic dysfunction of thoracic region: Secondary | ICD-10-CM | POA: Diagnosis not present

## 2016-08-15 DIAGNOSIS — M9905 Segmental and somatic dysfunction of pelvic region: Secondary | ICD-10-CM | POA: Diagnosis not present

## 2016-08-15 DIAGNOSIS — M9906 Segmental and somatic dysfunction of lower extremity: Secondary | ICD-10-CM | POA: Diagnosis not present

## 2016-08-15 DIAGNOSIS — M9903 Segmental and somatic dysfunction of lumbar region: Secondary | ICD-10-CM | POA: Diagnosis not present

## 2016-08-20 DIAGNOSIS — M9903 Segmental and somatic dysfunction of lumbar region: Secondary | ICD-10-CM | POA: Diagnosis not present

## 2016-08-20 DIAGNOSIS — M9902 Segmental and somatic dysfunction of thoracic region: Secondary | ICD-10-CM | POA: Diagnosis not present

## 2016-08-20 DIAGNOSIS — M9906 Segmental and somatic dysfunction of lower extremity: Secondary | ICD-10-CM | POA: Diagnosis not present

## 2016-08-20 DIAGNOSIS — M9905 Segmental and somatic dysfunction of pelvic region: Secondary | ICD-10-CM | POA: Diagnosis not present

## 2016-08-27 DIAGNOSIS — M9902 Segmental and somatic dysfunction of thoracic region: Secondary | ICD-10-CM | POA: Diagnosis not present

## 2016-08-27 DIAGNOSIS — M9905 Segmental and somatic dysfunction of pelvic region: Secondary | ICD-10-CM | POA: Diagnosis not present

## 2016-08-27 DIAGNOSIS — M9903 Segmental and somatic dysfunction of lumbar region: Secondary | ICD-10-CM | POA: Diagnosis not present

## 2016-08-27 DIAGNOSIS — M9906 Segmental and somatic dysfunction of lower extremity: Secondary | ICD-10-CM | POA: Diagnosis not present

## 2016-10-31 DIAGNOSIS — M7601 Gluteal tendinitis, right hip: Secondary | ICD-10-CM | POA: Diagnosis not present

## 2016-10-31 DIAGNOSIS — S73191A Other sprain of right hip, initial encounter: Secondary | ICD-10-CM | POA: Diagnosis not present

## 2016-10-31 DIAGNOSIS — R2689 Other abnormalities of gait and mobility: Secondary | ICD-10-CM | POA: Diagnosis not present

## 2016-10-31 DIAGNOSIS — M1611 Unilateral primary osteoarthritis, right hip: Secondary | ICD-10-CM | POA: Diagnosis not present

## 2016-10-31 DIAGNOSIS — M25551 Pain in right hip: Secondary | ICD-10-CM | POA: Diagnosis not present

## 2016-10-31 DIAGNOSIS — M25651 Stiffness of right hip, not elsewhere classified: Secondary | ICD-10-CM | POA: Diagnosis not present

## 2017-01-10 ENCOUNTER — Encounter: Payer: Self-pay | Admitting: Family Medicine

## 2017-01-25 ENCOUNTER — Encounter: Payer: Self-pay | Admitting: Cardiovascular Disease

## 2017-01-28 ENCOUNTER — Ambulatory Visit (INDEPENDENT_AMBULATORY_CARE_PROVIDER_SITE_OTHER)
Admission: RE | Admit: 2017-01-28 | Discharge: 2017-01-28 | Disposition: A | Discharge status: Home / Self Care | Payer: BLUE CROSS/BLUE SHIELD | Source: Ambulatory Visit | Attending: Cardiovascular Disease | Admitting: Cardiovascular Disease

## 2017-01-28 DIAGNOSIS — R918 Other nonspecific abnormal finding of lung field: Secondary | ICD-10-CM | POA: Diagnosis not present

## 2017-01-28 DIAGNOSIS — I712 Thoracic aortic aneurysm, without rupture, unspecified: Secondary | ICD-10-CM

## 2017-01-28 MED ORDER — IOPAMIDOL (ISOVUE-370) INJECTION 76%
100.0000 mL | Freq: Once | INTRAVENOUS | Status: AC | PRN
  Administered 2017-01-28: 100 mL via INTRAVENOUS

## 2017-01-29 ENCOUNTER — Encounter: Payer: Self-pay | Admitting: Cardiovascular Disease

## 2017-01-29 ENCOUNTER — Encounter: Payer: Self-pay | Admitting: Adult Health

## 2017-01-30 ENCOUNTER — Telehealth: Payer: Self-pay | Admitting: Pulmonary Disease

## 2017-01-30 ENCOUNTER — Encounter: Payer: Self-pay | Admitting: Adult Health

## 2017-01-30 NOTE — Telephone Encounter (Signed)
Spoke with patient. He is asking for a new RX for a new CPAP machine. Advised patient that he had not been in over a year and that an OV would be needed.   Scheduled patient with TP for tomorrow at 315pm. He is aware of appt. Nothing else needed at time of call.

## 2017-01-31 ENCOUNTER — Encounter: Payer: Self-pay | Admitting: Adult Health

## 2017-01-31 ENCOUNTER — Ambulatory Visit (INDEPENDENT_AMBULATORY_CARE_PROVIDER_SITE_OTHER): Payer: BLUE CROSS/BLUE SHIELD | Admitting: Adult Health

## 2017-01-31 VITALS — BP 124/72 | HR 67 | Ht 69.5 in | Wt 223.0 lb

## 2017-01-31 DIAGNOSIS — G4733 Obstructive sleep apnea (adult) (pediatric): Secondary | ICD-10-CM | POA: Diagnosis not present

## 2017-01-31 DIAGNOSIS — E669 Obesity, unspecified: Secondary | ICD-10-CM

## 2017-01-31 NOTE — Assessment & Plan Note (Signed)
Wt loss  

## 2017-01-31 NOTE — Progress Notes (Signed)
I have reviewed and agree with assessment/plan.  Reeta Kuk, MD [place] 01/31/2017, 8:11 PM Pager:  [phone]  

## 2017-01-31 NOTE — Patient Instructions (Addendum)
Keep up the good work. Continue on C Pap at bedtime Order for new machine sent Remain active. Keep healthy weight. Do not drive a sleepy Follow-up in one year with Dr. Craige Cotta  and as needed

## 2017-01-31 NOTE — Assessment & Plan Note (Signed)
Well controlled on CPAP   Plan  . Patient Instructions  Keep up the good work. Continue on C Pap at bedtime Order for new machine sent Remain active. Keep healthy weight. Do not drive a sleepy Follow-up in one year and as needed

## 2017-01-31 NOTE — Progress Notes (Signed)
  ID: Joshua King, male    DOB: Apr 27, 1960, 56 y.o.   MRN: [id number]  Chief Complaint  Patient presents with  . Follow-up    OSA     Referring provider: Kristian Covey, MD  HPI: 56 yo male followed for OSA   Tests: HST 06/07/11 >> AHI 19.6, SpO2 low 73%. Echo 01/07/15 >> EF 55 to 60%, mod aortic dilation CT angio chest 02/03/15 >> 4.2 cm ascending thoracic aortic dilation, 5 mm nodule along Rt minor fissure Auto CPAP 05/31/15 to 08/28/15 >> used on 89 of 90 nights with average 7 hrs 11 min.  Average AHI 1.6 with median CPAP 7 and 95 th percentile CPAP 10 cm H2O   01/31/2017  Follow up : OSA  Pt returns for follow up for sleep apnea. He is doing very well on CPAP . Cant go without it.  Says he uses everything on eye around 6-7 hours. He feels rested with no significant daytime sleepiness. Download shows excellent compliance with 100% usage. Average daily usage at 7 hours. Patient is on AutoSet 4-12 cm H2O. AHI 1.2. Minimal leaks.. Patient says his machine is greater than 74 years old edema like to get a new machine. He travels extensively throughout the year. He would like to look into a mini C Pap machine.  No Known Allergies  Immunization History  Administered Date(s) Administered  . Influenza Split 01/27/2013, 02/22/2014, 01/24/2017  . Pneumococcal Polysaccharide-23 01/28/2013  . Td 07/31/2008    Past Medical History:  Diagnosis Date  . CAD (coronary artery disease), native coronary artery 05/2012   a. s/p NSTEMI 2/14: LHC 06/17/12: Mid LAD 90% and 80%, D2 99%, EF 55-60%. PCI: Promus Premier DES to the mid LAD and Promus Premier DES to the D2  . Chicken pox   . GERD (gastroesophageal reflux disease)   . History of echocardiogram    Echo 9/16:  EF 55-60%, normal diastolic fxn, mod dilatation of aortic sinuses, mild LAE  . Hyperlipidemia   . Hypertension   . Sinus bradycardia     Tobacco History: History  Smoking Status  . Never Smoker  Smokeless Tobacco    . Never Used   Counseling given: Not Answered   Outpatient Encounter Prescriptions as of 01/31/2017  Medication Sig  . buPROPion (WELLBUTRIN XL) 150 MG 24 hr tablet Take 1 tablet by mouth daily.  Marland Kitchen NITROSTAT 0.4 MG SL tablet PLACE 1 TAB UNDER THE TONGUE EVERY 5 MINUTES X3 DOSES AS NEEDED FOR CHEST PAIN  . rosuvastatin (CRESTOR) 10 MG tablet Take 1 tablet (10 mg total) by mouth daily.  Marland Kitchen ALPRAZolam (XANAX) 0.5 MG tablet Take 1 tablet (0.5 mg total) by mouth 3 (three) times daily as needed for anxiety. (Patient not taking: Reported on 01/31/2017)  . aspirin 81 MG tablet Take 81 mg by mouth daily.   No facility-administered encounter medications on file as of 01/31/2017.      Review of Systems  Constitutional:   No  weight loss, night sweats,  Fevers, chills, fatigue, or  lassitude.  HEENT:   No headaches,  Difficulty swallowing,  Tooth/dental problems, or  Sore throat,                No sneezing, itching, ear ache, nasal congestion, post nasal drip,   CV:  No chest pain,  Orthopnea, PND, swelling in lower extremities, anasarca, dizziness, palpitations, syncope.   GI  No heartburn, indigestion, abdominal pain, nausea, vomiting, diarrhea, change in bowel habits,  loss of appetite, bloody stools.   Resp: No shortness of breath with exertion or at rest.  No excess mucus, no productive cough,  No non-productive cough,  No coughing up of blood.  No change in color of mucus.  No wheezing.  No chest wall deformity  Skin: no rash or lesions.  GU: no dysuria, change in color of urine, no urgency or frequency.  No flank pain, no hematuria   MS:  No joint pain or swelling.  No decreased range of motion.  No back pain.    Physical Exam  BP 124/72 (BP Location: Left Arm, Cuff Size: Normal)   Pulse 67   Ht 5' 9.5" (1.765 m)   Wt 223 lb (101.2 kg)   SpO2 98%   BMI 32.46 kg/m   GEN: A/Ox3; pleasant , NAD, obese    HEENT:  /AT,  EACs-clear, TMs-wnl, NOSE-clear, THROAT-clear, no  lesions, no postnasal drip or exudate noted. Class 2 MP airway   NECK:  Supple w/ fair ROM; no JVD; normal carotid impulses w/o bruits; no thyromegaly or nodules palpated; no lymphadenopathy.    RESP  Clear  P & A; w/o, wheezes/ rales/ or rhonchi. no accessory muscle use, no dullness to percussion  CARD:  RRR, no m/r/g, no peripheral edema, pulses intact, no cyanosis or clubbing.  GI:   Soft & nt; nml bowel sounds; no organomegaly or masses detected.   Musco: Warm bil, no deformities or joint swelling noted.   Neuro: alert, no focal deficits noted.    Skin: Warm, no lesions or rashes    Lab Results:  CBC  BMET  BNP No results found for: BNP  ProBNP Imaging:    Assessment & Plan:   OSA (obstructive sleep apnea) Well controlled on CPAP   Plan  . Patient Instructions  Keep up the good work. Continue on C Pap at bedtime Order for new machine sent Remain active. Keep healthy weight. Do not drive a sleepy Follow-up in one year and as needed    Obesity (BMI 30-39.9) Wt loss      Rubye Oaks, NP 01/31/2017

## 2017-03-01 DIAGNOSIS — G4733 Obstructive sleep apnea (adult) (pediatric): Secondary | ICD-10-CM | POA: Diagnosis not present

## 2017-04-17 DIAGNOSIS — Z9289 Personal history of other medical treatment: Secondary | ICD-10-CM | POA: Insufficient documentation

## 2017-04-17 DIAGNOSIS — B019 Varicella without complication: Secondary | ICD-10-CM | POA: Insufficient documentation

## 2017-04-17 DIAGNOSIS — E785 Hyperlipidemia, unspecified: Secondary | ICD-10-CM | POA: Insufficient documentation

## 2017-04-25 ENCOUNTER — Encounter: Payer: Self-pay | Admitting: Family Medicine

## 2017-04-26 ENCOUNTER — Encounter: Payer: Self-pay | Admitting: Family Medicine

## 2017-04-26 ENCOUNTER — Encounter: Payer: Self-pay | Admitting: Cardiovascular Disease

## 2017-04-29 NOTE — Telephone Encounter (Signed)
Last office visit 12/24/14 and last labs (Dr Mariann LasterBowden) 10/17/15.  Would you like the patient to come in for a medication check until he can schedule a physical?

## 2017-05-03 IMAGING — CT CT ANGIO CHEST
2 of 7 series · 18 of 46 positions shown · IV contrast (isovue)
Comparison: 02/03/2015

CLINICAL DATA: One year follow-up thoracic aortic aneurysm

EXAM:
CT ANGIOGRAPHY CHEST WITH CONTRAST
TECHNIQUE: Multidetector CT imaging of the chest was performed using the
standard protocol during bolus administration of intravenous
contrast. Multiplanar CT image reconstructions and MIPs were
obtained to evaluate the vascular anatomy.
CONTRAST:  100 cc Isovue 370 IV

[Series 4: aorta 3.0 i31f 2 · axial · 0.82mm/px · z∈[-351,-48]mm · 15 of 111 slices shown]
[im 5/111  lung]
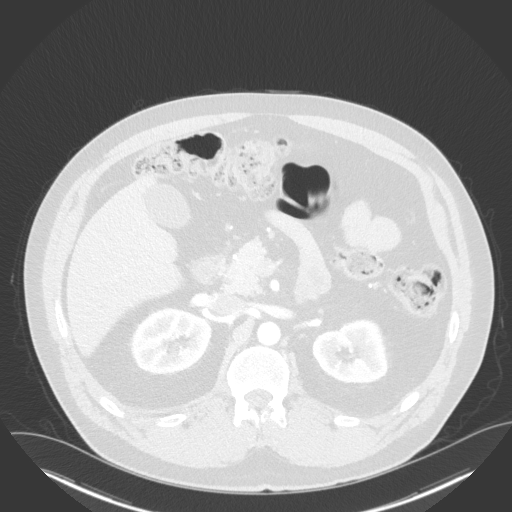
[im 13/111  soft-tissue]
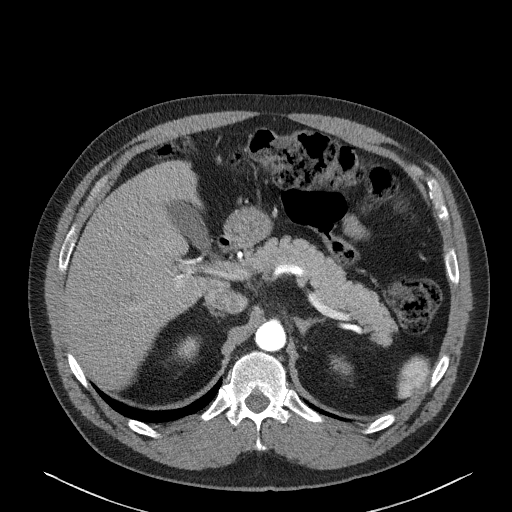
[im 21/111  lung]
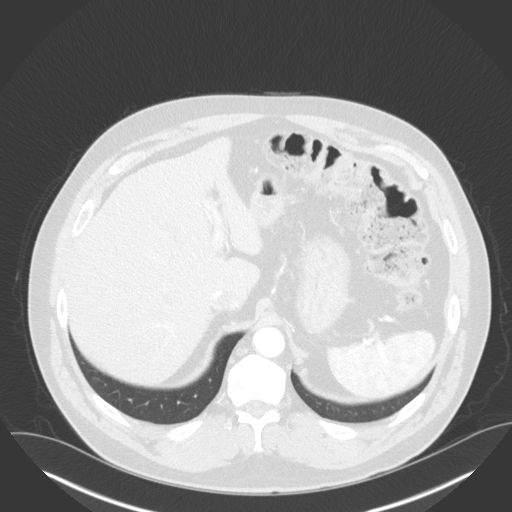
[im 29/111  soft-tissue]
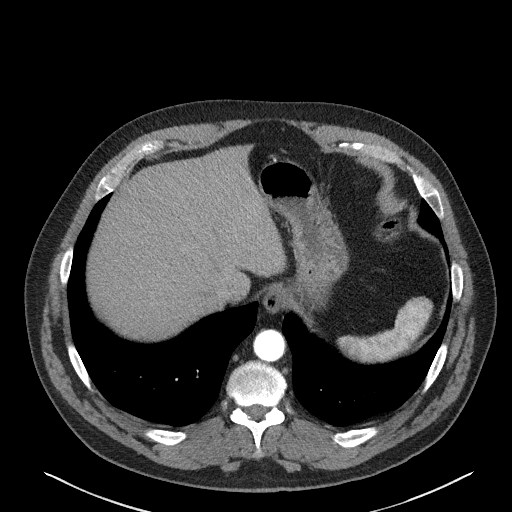
[im 33/111  lung]
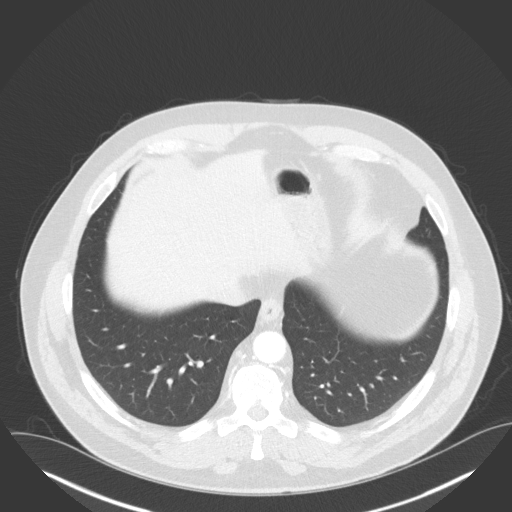
[im 41/111  soft-tissue]
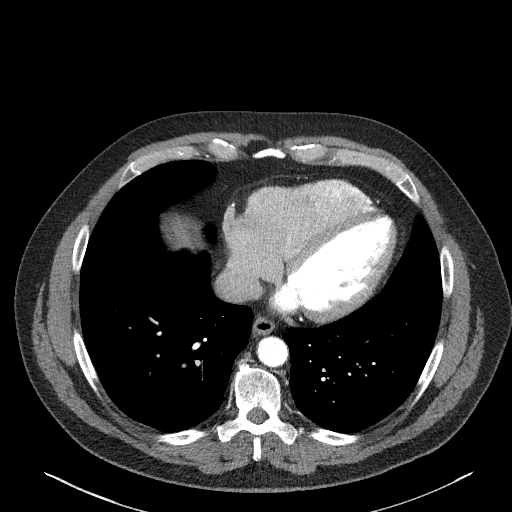
[im 49/111  lung]
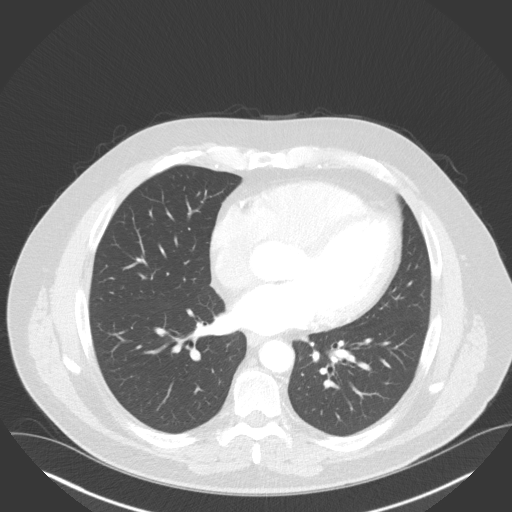
[im 58/111  soft-tissue]
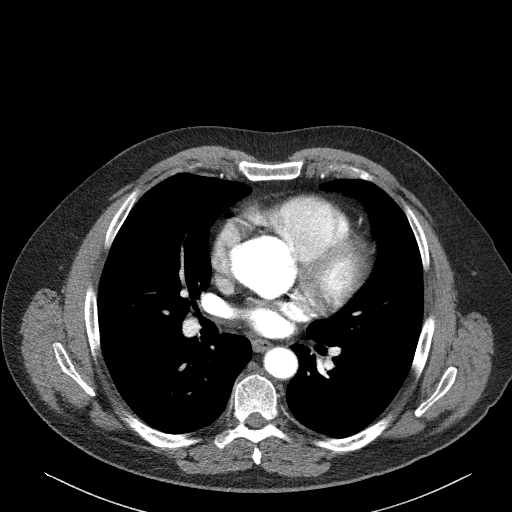
[im 62/111  lung]
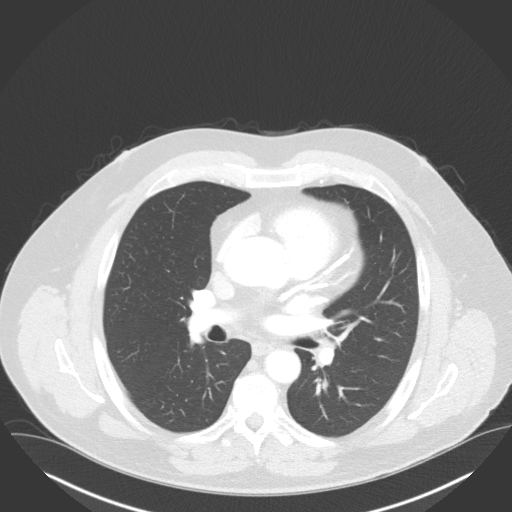
[im 70/111  soft-tissue]
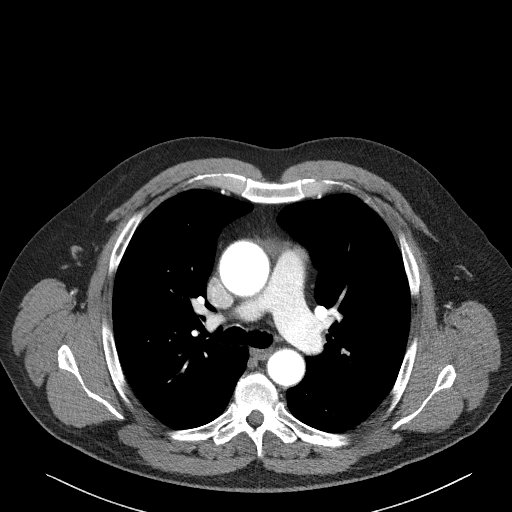
[im 78/111  lung]
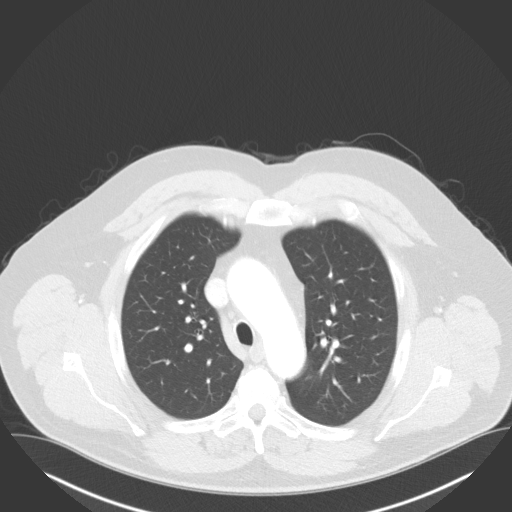
[im 82/111  soft-tissue]
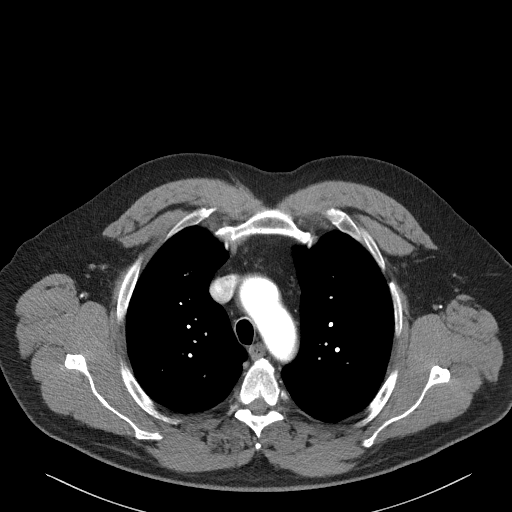
[im 90/111  lung]
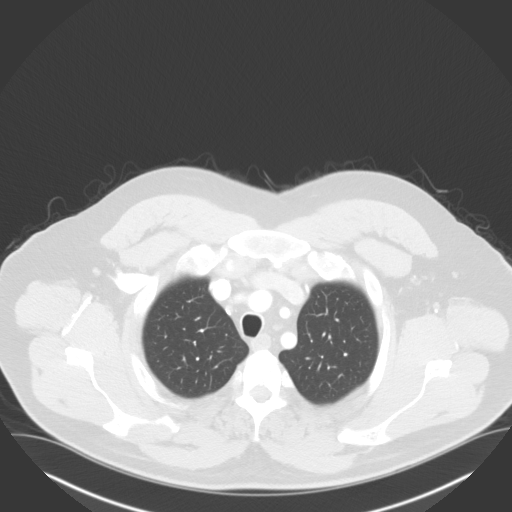
[im 98/111  soft-tissue]
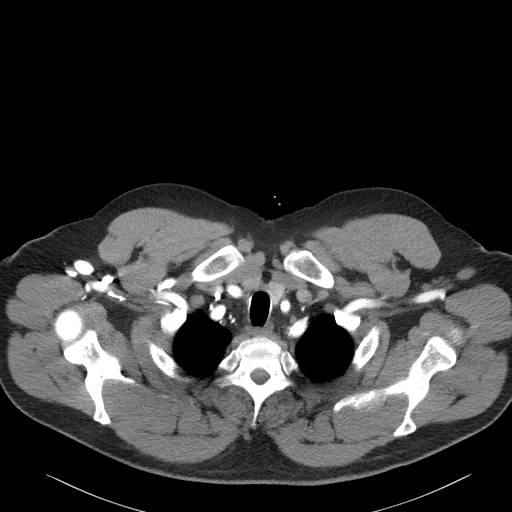
[im 106/111  lung]
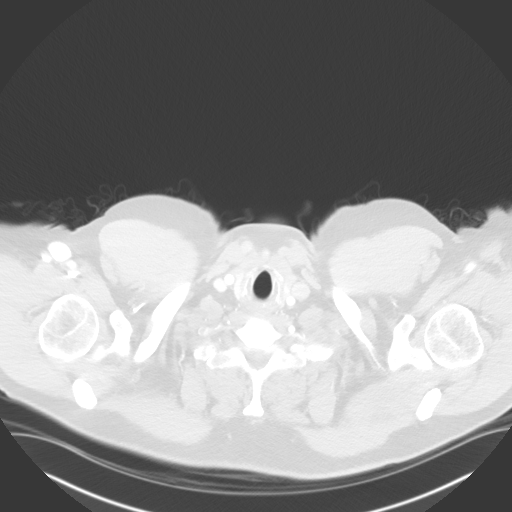

[Series 7: coronals · coronal · 0.68mm/px · 3 of 141 slices shown]
[im 36/141  soft-tissue]
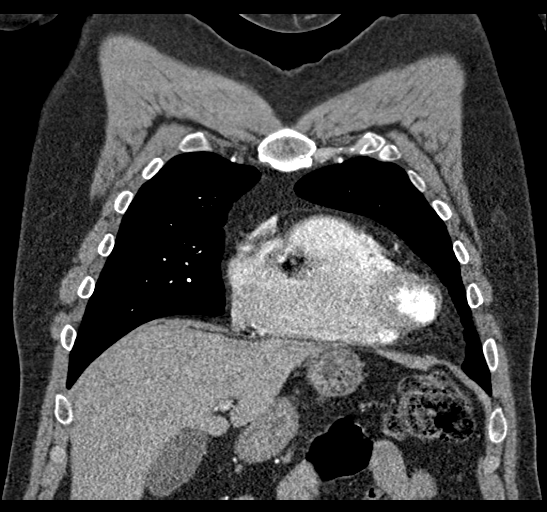
[im 71/141  soft-tissue]
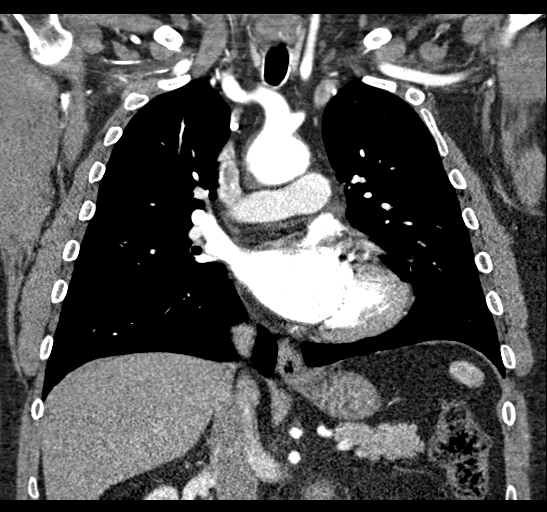
[im 106/141  soft-tissue]
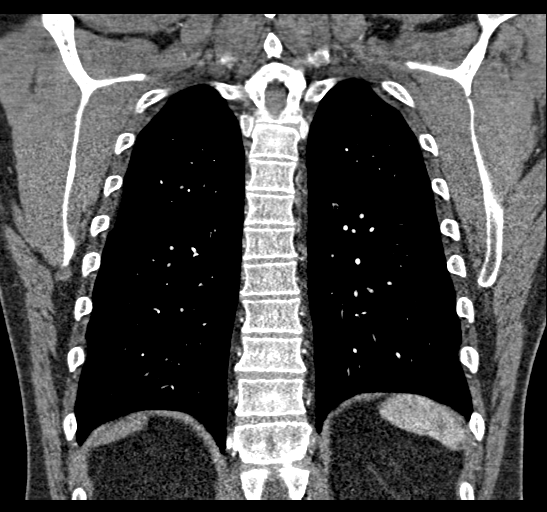

[18 of 46 positions shown; findings below may reference images not displayed]

FINDINGS: Cardiovascular: Slight dilatation of the ascending thoracic aorta,
4.1 cm maximally, stable. Aortic root at the sinus of Valsalva
measures up to 4.4 cm on today's study compared with 5.1 cm
previously. Today's study was not ECG gated and motion artifact
degrades image quality within the proximal aorta. Stents noted
within the left anterior descending coronary artery and a diagonal
branch. Heart is borderline in size.

Mediastinum/Nodes: No mediastinal, hilar, or axillary adenopathy.

Lungs/Pleura: Area of focal thickening noted along the right minor
fissure on image 49, stable.

Upper Abdomen: Imaging into the upper abdomen shows no acute
findings.

Musculoskeletal: Chest wall soft tissues are unremarkable. No acute
bony abnormality or focal bone lesion.

Review of the MIP images confirms the above findings.
IMPRESSION: Stable slight aneurysmal dilatation of the mid ascending thoracic
aorta, 4.1 cm. The aortic root at the sinuses of Valsalva measures
smaller on today's study. Today's study was not ECG gated and motion
artifact degrades image quality in the region of the aortic root.

Stable area of focal thickening/nodule along the right minor
fissure. This could be followed with repeat CT in 12 months to
ensure a 2 year stability.

Coronary artery disease with prior stenting.

## 2017-05-07 DIAGNOSIS — Z125 Encounter for screening for malignant neoplasm of prostate: Secondary | ICD-10-CM | POA: Diagnosis not present

## 2017-05-07 DIAGNOSIS — R350 Frequency of micturition: Secondary | ICD-10-CM | POA: Diagnosis not present

## 2017-05-08 ENCOUNTER — Encounter: Payer: Self-pay | Admitting: Cardiovascular Disease

## 2017-05-08 ENCOUNTER — Ambulatory Visit: Payer: BLUE CROSS/BLUE SHIELD | Admitting: Cardiovascular Disease

## 2017-05-08 VITALS — BP 136/82 | HR 60 | Ht 69.5 in | Wt 229.8 lb

## 2017-05-08 DIAGNOSIS — E785 Hyperlipidemia, unspecified: Secondary | ICD-10-CM | POA: Diagnosis not present

## 2017-05-08 DIAGNOSIS — I10 Essential (primary) hypertension: Secondary | ICD-10-CM

## 2017-05-08 DIAGNOSIS — I712 Thoracic aortic aneurysm, without rupture, unspecified: Secondary | ICD-10-CM

## 2017-05-08 DIAGNOSIS — I2510 Atherosclerotic heart disease of native coronary artery without angina pectoris: Secondary | ICD-10-CM | POA: Diagnosis not present

## 2017-05-08 MED ORDER — ROSUVASTATIN CALCIUM 10 MG PO TABS: 10.0000 mg | ORAL_TABLET | Freq: Every day | ORAL | 3 refills | Status: DC

## 2017-05-08 MED ORDER — NITROGLYCERIN 0.4 MG SL SUBL: SUBLINGUAL_TABLET | SUBLINGUAL | 6 refills | Status: DC

## 2017-05-08 MED ORDER — EZETIMIBE 10 MG PO TABS: 10.0000 mg | ORAL_TABLET | Freq: Every day | ORAL | 3 refills | Status: DC

## 2017-05-08 NOTE — Patient Instructions (Signed)
Medication Instructions:  Your physician has recommended you make the following change in your medication:  Start Zetia 10 mg by mouth daily   Labwork: Your physician recommends that you return for fasting lab work in: about 12 weeks.  (Lipid profile)   Testing/Procedures: Non-Cardiac CT Angiography (CTA), is a special type of CT scan that uses a computer to produce multi-dimensional views of major blood vessels throughout the body. In CT angiography, a contrast material is injected through an IV to help visualize the blood vessels  To be done in October   Follow-Up: Your physician recommends that you schedule a follow-up appointment in: 12 months. Please call our office in about 9 months to schedule this appointment    Any Other Special Instructions Will Be Listed Below (If Applicable).     If you need a refill on your cardiac medications before your next appointment, please call your pharmacy.

## 2017-05-08 NOTE — Progress Notes (Signed)
Chief Complaint  Patient presents with  . Coronary Artery Disease    History of Present Illness: 57 yo male with history of CAD, HTN, HLD, GERD, OSA on CPAP, PVCs here today for cardiac follow up. He was admitted to [facility] in 2014 with a NSTEMI. Cardiac cath with severe stenosis mid LAD and second Diagonal. Drug eluting stents were placed in the LAD and diagonal branch. He has not been treated with a beta blocker due to bradycardia. His Lisinopril was stopped due to hypotension. Exercise stress test in our office May 2014 without ischemia. In 2016, he went to the ED in [place] with chest pain, was admitted and had a stress test which was reported as normal. Echo 01/07/15 with normal LV function, dilated aortic root. Chest CTA October 2018 with stable mildly dilated ascending aorta (4.2 cm). Right lung nodule noted in 2016, stable over last two years and felt to be benign. 30 day event monitor in October 2016 showed sinus rhythm with rare PVCs.    He is here today for follow up. The patient denies any chest pain, dyspnea, palpitations, lower extremity edema, orthopnea, PND, dizziness, near syncope or syncope. He is now exercising at [employer].   Primary Care Physician: Kristian CoveyBurchette, Bruce W, MD  Past Medical History:  Diagnosis Date  . CAD (coronary artery disease), native coronary artery 05/2012   a. s/p NSTEMI 2/14: LHC 06/17/12: Mid LAD 90% and 80%, D2 99%, EF 55-60%. PCI: Promus Premier DES to the mid LAD and Promus Premier DES to the D2  . Chicken pox   . GERD (gastroesophageal reflux disease)   . History of echocardiogram    Echo 9/16:  EF 55-60%, normal diastolic fxn, mod dilatation of aortic sinuses, mild LAE  . Hyperlipidemia   . Hypertension   . Sinus bradycardia     Past Surgical History:  Procedure Laterality Date  . APPENDECTOMY  1992  . CORONARY ANGIOPLASTY WITH STENT PLACEMENT  05/2012   90% then 80% mid LAD stenoses s/p DES x 1, 99% D2 lesion s/p DES x 1; LVEF 55-60%    . HERNIA REPAIR  1999   inguinal  . LEFT HEART CATHETERIZATION WITH CORONARY ANGIOGRAM N/A 06/17/2012   Procedure: LEFT HEART CATHETERIZATION WITH CORONARY ANGIOGRAM;  Surgeon: Kathleene Hazelhristopher D Rudransh Bellanca, MD;  [facility];  Service: Cardiovascular;  Laterality: N/A;  . PERCUTANEOUS CORONARY STENT INTERVENTION (PCI-S)  06/17/2012   Procedure: PERCUTANEOUS CORONARY STENT INTERVENTION (PCI-S);  Surgeon: Kathleene Hazelhristopher D Almee Pelphrey, MD;  [facility];  Service: Cardiovascular;;    Current Outpatient Medications  Medication Sig Dispense Refill  . ALPRAZolam (XANAX) 0.5 MG tablet Take 1 tablet (0.5 mg total) by mouth 3 (three) times daily as needed for anxiety. 30 tablet 0  . aspirin 81 MG tablet Take 81 mg by mouth daily.    Marland Kitchen. buPROPion (WELLBUTRIN XL) 150 MG 24 hr tablet Take 1 tablet by mouth daily.    . nitroGLYCERIN (NITROSTAT) 0.4 MG SL tablet PLACE 1 TAB UNDER THE TONGUE EVERY 5 MINUTES X3 DOSES AS NEEDED FOR CHEST PAIN 25 tablet 6  . rosuvastatin (CRESTOR) 10 MG tablet Take 1 tablet (10 mg total) by mouth daily. 90 tablet 3  . ezetimibe (ZETIA) 10 MG tablet Take 1 tablet (10 mg total) by mouth daily. 90 tablet 3   No current facility-administered medications for this visit.     No Known Allergies  Social History   Socioeconomic History  . Marital status: Married  Spouse name: Not on file  . Number of children: 2  . Years of education: Not on file  . Highest education level: Not on file  Social Needs  . Financial resource strain: Not on file  . Food insecurity - worry: Not on file  . Food insecurity - inability: Not on file  . Transportation needs - medical: Not on file  . Transportation needs - non-medical: Not on file  Occupational History  . Occupation: Agricultural engineer: [employer]  Tobacco Use  . Smoking status: Never Smoker  . Smokeless tobacco: Never Used  Substance and Sexual Activity  . Alcohol use: Yes    Alcohol/week: 3.6 -  4.8 oz    Types: 6 - 8 Standard drinks or equivalent per week  . Drug use: No  . Sexual activity: Not on file  Other Topics Concern  . Not on file  Social History Narrative  . Not on file    Family History  Problem Relation Age of Onset  . CAD Father 79  . Heart attack Father   . Arthritis Father   . Cancer Father        prostate  . Heart disease Father   . Alcohol abuse Mother   . Hypertension Mother     Review of Systems:  As stated in the HPI and otherwise negative.   BP 136/82   Pulse 60   Ht 5' 9.5" (1.765 m)   Wt 229 lb 12.8 oz (104.2 kg)   SpO2 98%   BMI 33.45 kg/m   Physical Examination:  General: Well developed, well nourished, NAD  HEENT: OP clear, mucus membranes moist  SKIN: warm, dry. No rashes. Neuro: No focal deficits  Musculoskeletal: Muscle strength 5/5 all ext  Psychiatric: Mood and affect normal  Neck: No JVD, no carotid bruits, no thyromegaly, no lymphadenopathy.  Lungs:Clear bilaterally, no wheezes, rhonci, crackles Cardiovascular: Regular rate and rhythm. No murmurs, gallops or rubs. Abdomen:Soft. Bowel sounds present. Non-tender.  Extremities: No lower extremity edema. Pulses are 2 + in the bilateral DP/PT.  Echo 01/07/15: Left ventricle: The cavity size was normal. Wall thickness was normal. Systolic function was normal. The estimated ejection fraction was in the range of 55% to 60%. Left ventricular diastolic function parameters were normal. - Aorta: Moderate dilatation of the aortic sinuses Suggest cardiac CTA/ MRI to further assess. - Left atrium: The atrium was mildly dilated. - Atrial septum: No defect or patent foramen ovale was identified.  Chest CTA 02/03/15: Aneurysmal dilatation of the aortic root and ascending thoracic aorta. Mid ascending thoracic aorta measures up to 4.2 cm. Recommend annual imaging followup by CTA or MRA. This recommendation follows 2010 ACCF/AHA/AATS/ACR/ASA/SCA/SCAI/SIR/STS/SVM Guidelines for  the Diagnosis and Management of Patients with Thoracic Aortic Disease. Circulation. 2010; 121: W098-J191 Coronary artery calcifications with coronary artery stents. 5 mm density along the right minor fissure could be focal thickening but indeterminate. If the patient is at high risk for bronchogenic carcinoma, follow-up chest CT at 6-12 months is recommended. If the patient is at low risk for bronchogenic carcinoma, follow-up chest CT at 12 months is recommended. This recommendation follows the consensus statement: Guidelines for Management of Small Pulmonary Nodules Detected on CT Scans: A Statement from the Fleischner Society as published in Radiology 2005;237:395-400.  EKG:  EKG is ordered today. The ekg ordered today demonstrates NSR, rate 60 bpm.   Recent Labs: No results found for requested labs within last 8760 hours.   Lipid Panel  Component Value Date/Time   CHOL 163 10/17/2015 0858   CHOL 165 12/07/2013 0859   TRIG 57 10/17/2015 0858   TRIG 70 12/07/2013 0859   HDL 72 10/17/2015 0858   HDL 60 12/07/2013 0859   CHOLHDL 2.3 10/17/2015 0858   VLDL 11 10/17/2015 0858   LDLCALC 80 10/17/2015 0858   LDLCALC 91 12/07/2013 0859     Wt Readings from Last 3 Encounters:  05/08/17 229 lb 12.8 oz (104.2 kg)  01/31/17 223 lb (101.2 kg)  04/27/16 240 lb 6.4 oz (109 kg)     Other studies Reviewed: Additional studies/ records that were reviewed today include: . Review of the above records demonstrates:    Assessment and Plan:   1. CAD without angina: No chest pain suggestive of angina. For now, will continue ASA and statin. He does not tolerate beta blockers due to bradycardia  2. HTN: BP is controlled today.   3. Hyperlipidemia: He is tolerating low dose Crestor. LDL 95. He does not wish to take a higher dose of Crestor due to muscle aches previously on higher doses of statins. Will add Zetia 10 mg daily. Repeat lipids in 12 weeks.    4. Thoracic aortic aneurysm:  Mild dilation of the ascending aorta, 4.2 cm by CTA in October 2018. This is stable. Repeat CTA October 2019.    6. Pulmonary nodule: stable over several years. Felt to be benign.    Current medicines are reviewed at length with the patient today.  The patient does not have concerns regarding medicines.  The following changes have been made:  no change  Labs/ tests ordered today include:   Orders Placed This Encounter  Procedures  . CT ANGIO CHEST AORTA W &/OR WO CONTRAST  . Lipid Profile  . EKG 12-Lead    Disposition:   FU with me in 12months  Signed, Verne Carrow, MD 05/08/2017 10:52 AM    [facility] [address] [place], [place], [place]  [address] Phone: [phone]; Fax: [phone]

## 2017-07-31 ENCOUNTER — Other Ambulatory Visit: Payer: BLUE CROSS/BLUE SHIELD

## 2017-10-01 DIAGNOSIS — M9902 Segmental and somatic dysfunction of thoracic region: Secondary | ICD-10-CM | POA: Diagnosis not present

## 2017-10-01 DIAGNOSIS — M9903 Segmental and somatic dysfunction of lumbar region: Secondary | ICD-10-CM | POA: Diagnosis not present

## 2017-10-01 DIAGNOSIS — M9905 Segmental and somatic dysfunction of pelvic region: Secondary | ICD-10-CM | POA: Diagnosis not present

## 2017-10-01 DIAGNOSIS — M9906 Segmental and somatic dysfunction of lower extremity: Secondary | ICD-10-CM | POA: Diagnosis not present

## 2017-10-10 DIAGNOSIS — M9902 Segmental and somatic dysfunction of thoracic region: Secondary | ICD-10-CM | POA: Diagnosis not present

## 2017-10-10 DIAGNOSIS — M9906 Segmental and somatic dysfunction of lower extremity: Secondary | ICD-10-CM | POA: Diagnosis not present

## 2017-10-10 DIAGNOSIS — M9903 Segmental and somatic dysfunction of lumbar region: Secondary | ICD-10-CM | POA: Diagnosis not present

## 2017-10-10 DIAGNOSIS — M9905 Segmental and somatic dysfunction of pelvic region: Secondary | ICD-10-CM | POA: Diagnosis not present

## 2017-10-11 DIAGNOSIS — M9905 Segmental and somatic dysfunction of pelvic region: Secondary | ICD-10-CM | POA: Diagnosis not present

## 2017-10-11 DIAGNOSIS — M9903 Segmental and somatic dysfunction of lumbar region: Secondary | ICD-10-CM | POA: Diagnosis not present

## 2017-10-11 DIAGNOSIS — M9906 Segmental and somatic dysfunction of lower extremity: Secondary | ICD-10-CM | POA: Diagnosis not present

## 2017-10-11 DIAGNOSIS — M9902 Segmental and somatic dysfunction of thoracic region: Secondary | ICD-10-CM | POA: Diagnosis not present

## 2017-10-18 DIAGNOSIS — M9902 Segmental and somatic dysfunction of thoracic region: Secondary | ICD-10-CM | POA: Diagnosis not present

## 2017-10-18 DIAGNOSIS — M9906 Segmental and somatic dysfunction of lower extremity: Secondary | ICD-10-CM | POA: Diagnosis not present

## 2017-10-18 DIAGNOSIS — H524 Presbyopia: Secondary | ICD-10-CM | POA: Diagnosis not present

## 2017-10-18 DIAGNOSIS — M9903 Segmental and somatic dysfunction of lumbar region: Secondary | ICD-10-CM | POA: Diagnosis not present

## 2017-10-18 DIAGNOSIS — M9905 Segmental and somatic dysfunction of pelvic region: Secondary | ICD-10-CM | POA: Diagnosis not present

## 2017-10-25 DIAGNOSIS — J029 Acute pharyngitis, unspecified: Secondary | ICD-10-CM | POA: Diagnosis not present

## 2017-10-25 DIAGNOSIS — J069 Acute upper respiratory infection, unspecified: Secondary | ICD-10-CM | POA: Diagnosis not present

## 2017-10-25 DIAGNOSIS — Z6832 Body mass index (BMI) 32.0-32.9, adult: Secondary | ICD-10-CM | POA: Diagnosis not present

## 2017-10-25 DIAGNOSIS — Z202 Contact with and (suspected) exposure to infections with a predominantly sexual mode of transmission: Secondary | ICD-10-CM | POA: Diagnosis not present

## 2018-02-05 ENCOUNTER — Ambulatory Visit (INDEPENDENT_AMBULATORY_CARE_PROVIDER_SITE_OTHER)
Admission: RE | Admit: 2018-02-05 | Discharge: 2018-02-05 | Disposition: A | Discharge status: Home / Self Care | Payer: BLUE CROSS/BLUE SHIELD | Source: Ambulatory Visit | Attending: Cardiovascular Disease | Admitting: Cardiovascular Disease

## 2018-02-05 DIAGNOSIS — I712 Thoracic aortic aneurysm, without rupture, unspecified: Secondary | ICD-10-CM

## 2018-02-05 MED ORDER — IOPAMIDOL (ISOVUE-370) INJECTION 76%
100.0000 mL | Freq: Once | INTRAVENOUS | Status: AC | PRN
  Administered 2018-02-05: 100 mL via INTRAVENOUS

## 2018-02-20 DIAGNOSIS — L821 Other seborrheic keratosis: Secondary | ICD-10-CM | POA: Diagnosis not present

## 2018-02-20 DIAGNOSIS — D225 Melanocytic nevi of trunk: Secondary | ICD-10-CM | POA: Diagnosis not present

## 2018-02-20 DIAGNOSIS — R208 Other disturbances of skin sensation: Secondary | ICD-10-CM | POA: Diagnosis not present

## 2018-02-20 DIAGNOSIS — B353 Tinea pedis: Secondary | ICD-10-CM | POA: Diagnosis not present

## 2018-02-20 DIAGNOSIS — D1801 Hemangioma of skin and subcutaneous tissue: Secondary | ICD-10-CM | POA: Diagnosis not present

## 2018-02-20 DIAGNOSIS — L720 Epidermal cyst: Secondary | ICD-10-CM | POA: Diagnosis not present

## 2018-03-11 DIAGNOSIS — K219 Gastro-esophageal reflux disease without esophagitis: Secondary | ICD-10-CM | POA: Diagnosis not present

## 2018-03-11 DIAGNOSIS — K449 Diaphragmatic hernia without obstruction or gangrene: Secondary | ICD-10-CM | POA: Diagnosis not present

## 2018-03-11 DIAGNOSIS — Z1211 Encounter for screening for malignant neoplasm of colon: Secondary | ICD-10-CM | POA: Diagnosis not present

## 2018-03-11 DIAGNOSIS — Z9089 Acquired absence of other organs: Secondary | ICD-10-CM | POA: Diagnosis not present

## 2018-03-11 DIAGNOSIS — K5730 Diverticulosis of large intestine without perforation or abscess without bleeding: Secondary | ICD-10-CM | POA: Diagnosis not present

## 2018-04-03 ENCOUNTER — Telehealth: Payer: Self-pay | Admitting: Cardiovascular Disease

## 2018-04-03 NOTE — Telephone Encounter (Signed)
Left message to call back  

## 2018-04-03 NOTE — Telephone Encounter (Signed)
New Message   Received message from patient in reference to patient traveling to [place]. Patient is inquiring about whether or not he can obtain the Hepatitis C Vaccine. Please call to advise.

## 2018-04-03 NOTE — Telephone Encounter (Signed)
No contra-indication from a cardiac standpoint but he would need to discuss this with his primary care doctor. Joshua King

## 2018-04-04 NOTE — Telephone Encounter (Signed)
Left message to call back  

## 2018-04-07 NOTE — Telephone Encounter (Signed)
Pt.notified

## 2018-05-22 ENCOUNTER — Other Ambulatory Visit: Payer: Self-pay | Admitting: Cardiovascular Disease

## 2018-05-22 DIAGNOSIS — E785 Hyperlipidemia, unspecified: Secondary | ICD-10-CM

## 2018-05-29 ENCOUNTER — Ambulatory Visit: Payer: BLUE CROSS/BLUE SHIELD | Admitting: Cardiovascular Disease

## 2018-05-29 ENCOUNTER — Encounter: Payer: Self-pay | Admitting: Cardiovascular Disease

## 2018-05-29 VITALS — BP 142/90 | HR 47 | Ht 69.0 in | Wt 230.0 lb

## 2018-05-29 DIAGNOSIS — E7800 Pure hypercholesterolemia, unspecified: Secondary | ICD-10-CM

## 2018-05-29 DIAGNOSIS — I712 Thoracic aortic aneurysm, without rupture, unspecified: Secondary | ICD-10-CM

## 2018-05-29 DIAGNOSIS — I2510 Atherosclerotic heart disease of native coronary artery without angina pectoris: Secondary | ICD-10-CM | POA: Diagnosis not present

## 2018-05-29 DIAGNOSIS — I10 Essential (primary) hypertension: Secondary | ICD-10-CM

## 2018-05-29 MED ORDER — EZETIMIBE 10 MG PO TABS: 10.0000 mg | ORAL_TABLET | Freq: Every day | ORAL | 3 refills | Status: DC

## 2018-05-29 NOTE — Progress Notes (Signed)
Chief Complaint  Patient presents with  . Follow-up    CAD    History of Present Illness: 58 yo male with history of CAD, HTN, HLD, GERD, sleep apnea and PVCs here today for cardiac follow up. He was admitted to [facility] in 2014 with a NSTEMI. Cardiac cath with severe stenosis mid LAD and second Diagonal. Drug eluting stents were placed in the LAD and diagonal branch. He has not been treated with a beta blocker due to bradycardia. His Lisinopril was stopped due to hypotension. Exercise stress test in our office May 2014 without ischemia. In 2016, he went to the ED in [place] with chest pain, was admitted and had a stress test which was reported as normal. Echo 01/07/15 with normal LV function, dilated aortic root. Chest CTA October 2019 with stable mildly dilated ascending aorta (4.3cm). Right lung nodule noted in 2016, stable over last two years and felt to be benign. 30 day event monitor in October 2016 showed sinus rhythm with rare PVCs.     He is here today for follow up. The patient denies any chest pain, dyspnea, palpitations, lower extremity edema, orthopnea, PND, dizziness, near syncope or syncope. He is exercising [facility] days per week at [employer].   Primary Care Physician: Kristian CoveyBurchette, Bruce W, MD  Past Medical History:  Diagnosis Date  . CAD (coronary artery disease), native coronary artery 05/2012   a. s/p NSTEMI 2/14: LHC 06/17/12: Mid LAD 90% and 80%, D2 99%, EF 55-60%. PCI: Promus Premier DES to the mid LAD and Promus Premier DES to the D2  . Chicken pox   . GERD (gastroesophageal reflux disease)   . History of echocardiogram    Echo 9/16:  EF 55-60%, normal diastolic fxn, mod dilatation of aortic sinuses, mild LAE  . Hyperlipidemia   . Hypertension   . Sinus bradycardia     Past Surgical History:  Procedure Laterality Date  . APPENDECTOMY  1992  . CORONARY ANGIOPLASTY WITH STENT PLACEMENT  05/2012   90% then 80% mid LAD stenoses s/p DES x 1, 99% D2 lesion s/p DES x 1; LVEF  55-60%  . HERNIA REPAIR  1999   inguinal  . LEFT HEART CATHETERIZATION WITH CORONARY ANGIOGRAM N/A 06/17/2012   Procedure: LEFT HEART CATHETERIZATION WITH CORONARY ANGIOGRAM;  Surgeon: Kathleene Hazelhristopher D Shadiamond Koska, MD;  [facility];  Service: Cardiovascular;  Laterality: N/A;  . PERCUTANEOUS CORONARY STENT INTERVENTION (PCI-S)  06/17/2012   Procedure: PERCUTANEOUS CORONARY STENT INTERVENTION (PCI-S);  Surgeon: Kathleene Hazelhristopher D Julen Rubert, MD;  [facility];  Service: Cardiovascular;;    Current Outpatient Medications  Medication Sig Dispense Refill  . ALPRAZolam (XANAX) 0.5 MG tablet Take 1 tablet (0.5 mg total) by mouth 3 (three) times daily as needed for anxiety. 30 tablet 0  . aspirin 81 MG tablet Take 81 mg by mouth daily.    Marland Kitchen. buPROPion (WELLBUTRIN XL) 150 MG 24 hr tablet Take 1 tablet by mouth daily.    Marland Kitchen. ezetimibe (ZETIA) 10 MG tablet Take 1 tablet (10 mg total) by mouth daily. 90 tablet 3  . nitroGLYCERIN (NITROSTAT) 0.4 MG SL tablet PLACE 1 TAB UNDER THE TONGUE EVERY 5 MINUTES X3 DOSES AS NEEDED FOR CHEST PAIN 25 tablet 6  . rosuvastatin (CRESTOR) 10 MG tablet TAKE 1 TABLET BY MOUTH EVERY DAY 90 tablet 0   No current facility-administered medications for this visit.     No Known Allergies  Social History   Socioeconomic History  . Marital status:  Married    Spouse name: Not on file  . Number of children: 2  . Years of education: Not on file  . Highest education level: Not on file  Occupational History  . Occupation: Agricultural engineer: [employer]  Social Needs  . Financial resource strain: Not on file  . Food insecurity:    Worry: Not on file    Inability: Not on file  . Transportation needs:    Medical: Not on file    Non-medical: Not on file  Tobacco Use  . Smoking status: Never Smoker  . Smokeless tobacco: Never Used  Substance and Sexual Activity  . Alcohol use: Yes    Alcohol/week: 6.0 - 8.0 standard drinks    Types: 6 - 8  Standard drinks or equivalent per week  . Drug use: No  . Sexual activity: Not on file  Lifestyle  . Physical activity:    Days per week: Not on file    Minutes per session: Not on file  . Stress: Not on file  Relationships  . Social connections:    Talks on phone: Not on file    Gets together: Not on file    Attends religious service: Not on file    Active member of club or organization: Not on file    Attends meetings of clubs or organizations: Not on file    Relationship status: Not on file  . Intimate partner violence:    Fear of current or ex partner: Not on file    Emotionally abused: Not on file    Physically abused: Not on file    Forced sexual activity: Not on file  Other Topics Concern  . Not on file  Social History Narrative  . Not on file    Family History  Problem Relation Age of Onset  . CAD Father 26  . Heart attack Father   . Arthritis Father   . Cancer Father        prostate  . Heart disease Father   . Alcohol abuse Mother   . Hypertension Mother     Review of Systems:  As stated in the HPI and otherwise negative.   BP (!) 142/90   Pulse (!) 47   Ht 5\' 9"  (1.753 m)   Wt 230 lb (104.3 kg)   SpO2 96%   BMI 33.97 kg/m   Physical Examination:  General: Well developed, well nourished, NAD  HEENT: OP clear, mucus membranes moist  SKIN: warm, dry. No rashes. Neuro: No focal deficits  Musculoskeletal: Muscle strength 5/5 all ext  Psychiatric: Mood and affect normal  Neck: No JVD, no carotid bruits, no thyromegaly, no lymphadenopathy.  Lungs:Clear bilaterally, no wheezes, rhonci, crackles Cardiovascular: Regular rate and rhythm. No murmurs, gallops or rubs. Abdomen:Soft. Bowel sounds present. Non-tender.  Extremities: No lower extremity edema. Pulses are 2 + in the bilateral DP/PT.  Echo 01/07/15: Left ventricle: The cavity size was normal. Wall thickness was normal. Systolic function was normal. The estimated ejection fraction was in the  range of 55% to 60%. Left ventricular diastolic function parameters were normal. - Aorta: Moderate dilatation of the aortic sinuses Suggest cardiac CTA/ MRI to further assess. - Left atrium: The atrium was mildly dilated. - Atrial septum: No defect or patent foramen ovale was identified.  EKG:  EKG is  ordered today. The ekg ordered today demonstrates Sinus brady, rate 47 bpm.   Recent Labs: No results found for requested labs within last  8760 hours.   Lipid Panel    Component Value Date/Time   CHOL 163 10/17/2015 0858   CHOL 165 12/07/2013 0859   TRIG 57 10/17/2015 0858   TRIG 70 12/07/2013 0859   HDL 72 10/17/2015 0858   HDL 60 12/07/2013 0859   CHOLHDL 2.3 10/17/2015 0858   VLDL 11 10/17/2015 0858   LDLCALC 80 10/17/2015 0858   LDLCALC 91 12/07/2013 0859     Wt Readings from Last 3 Encounters:  05/29/18 230 lb (104.3 kg)  05/08/17 229 lb 12.8 oz (104.2 kg)  01/31/17 223 lb (101.2 kg)     Other studies Reviewed: Additional studies/ records that were reviewed today include: . Review of the above records demonstrates:    Assessment and Plan:   1. CAD without angina: No chest pain. He does not tolerate beta blockers due to bradycardia. Will continue ASA and statin  2. HTN: BP is well controlled at home.   3. Hyperlipidemia: He does not tolerate high doses of statins. Will continue Crestor and Zetia. Lipids checked recently at work and ok per pt.   4. Thoracic aortic aneurysm: Mild dilation of the ascending aorta, 4.3 cm by CTA in October 2019. This is stable. Repeat October 2020.   6. Pulmonary nodule: stable over several years. Felt to be benign.    Current medicines are reviewed at length with the patient today.  The patient does not have concerns regarding medicines.  The following changes have been made:  no change  Labs/ tests ordered today include:   Orders Placed This Encounter  Procedures  . EKG 12-Lead    Disposition:   FU with me in  67months  Signed, Verne Carrow, MD 05/29/2018 10:52 AM    [facility] [address] [place], [place], [place]  [address] Phone: [phone]; Fax: [phone]

## 2018-05-29 NOTE — Patient Instructions (Signed)
Medication Instructions:  Your physician recommends that you continue on your current medications as directed. Please refer to the Current Medication list given to you today.  If you need a refill on your cardiac medications before your next appointment, please call your pharmacy.   Lab work: none If you have labs (blood work) drawn today and your tests are completely normal, you will receive your results only by: . MyChart Message (if you have MyChart) OR . A paper copy in the mail If you have any lab test that is abnormal or we need to change your treatment, we will call you to review the results.  Testing/Procedures: none  Follow-Up: At [facility], you and your health needs are our priority.  As part of our continuing mission to provide you with exceptional heart care, we have created designated Provider Care Teams.  These Care Teams include your primary Cardiologist (physician) and Advanced Practice Providers (APPs -  Physician Assistants and Nurse Practitioners) who all work together to provide you with the care you need, when you need it. You will need a follow up appointment in 12 months.  Please call our office 4 months in advance to schedule this appointment.  You may see Christopher McAlhany, MD or one of the following Advanced Practice Providers on your designated Care Team:   Brittainy Simmons, PA-C Dayna Dunn, PA-C . Michele Lenze, PA-C  Any Other Special Instructions Will Be Listed Below (If Applicable).    

## 2018-06-04 ENCOUNTER — Telehealth: Payer: Self-pay | Admitting: *Deleted

## 2018-06-04 DIAGNOSIS — I712 Thoracic aortic aneurysm, without rupture, unspecified: Secondary | ICD-10-CM

## 2018-06-04 NOTE — Telephone Encounter (Signed)
Orders placed for BMP and CTA.  To be done in October 2020

## 2018-09-25 DIAGNOSIS — Z1159 Encounter for screening for other viral diseases: Secondary | ICD-10-CM | POA: Diagnosis not present

## 2018-11-11 ENCOUNTER — Other Ambulatory Visit: Payer: Self-pay | Admitting: Cardiovascular Disease

## 2018-11-25 ENCOUNTER — Other Ambulatory Visit: Payer: Self-pay | Admitting: Cardiovascular Disease

## 2019-01-13 DIAGNOSIS — Z1159 Encounter for screening for other viral diseases: Secondary | ICD-10-CM | POA: Diagnosis not present

## 2019-01-13 DIAGNOSIS — Z03818 Encounter for observation for suspected exposure to other biological agents ruled out: Secondary | ICD-10-CM | POA: Diagnosis not present

## 2019-02-06 ENCOUNTER — Other Ambulatory Visit: Payer: Self-pay

## 2019-02-06 ENCOUNTER — Other Ambulatory Visit: Payer: BC Managed Care – PPO | Admitting: *Deleted

## 2019-02-06 DIAGNOSIS — I712 Thoracic aortic aneurysm, without rupture, unspecified: Secondary | ICD-10-CM

## 2019-02-06 LAB — BASIC METABOLIC PANEL
BUN/Creatinine Ratio: 15 (ref 10–24)
BUN: 16 mg/dL (ref 8–27)
CO2: 24 mmol/L (ref 20–29)
Calcium: 9.5 mg/dL (ref 8.6–10.2)
Chloride: 103 mmol/L (ref 96–106)
Creatinine, Ser: 1.1 mg/dL (ref 0.76–1.27)
GFR calc Af Amer: 84 mL/min/{1.73_m2} (ref >59)
GFR calc non Af Amer: 73 mL/min/{1.73_m2} (ref >59)
Glucose: 93 mg/dL (ref 65–99)
Potassium: 4.6 mmol/L (ref 3.5–5.2)
Sodium: 138 mmol/L (ref 134–144)

## 2019-02-13 ENCOUNTER — Other Ambulatory Visit: Payer: Self-pay

## 2019-02-13 ENCOUNTER — Ambulatory Visit (INDEPENDENT_AMBULATORY_CARE_PROVIDER_SITE_OTHER)
Admission: RE | Admit: 2019-02-13 | Discharge: 2019-02-13 | Disposition: A | Discharge status: Home / Self Care | Payer: BC Managed Care – PPO | Source: Ambulatory Visit | Attending: Cardiovascular Disease | Admitting: Cardiovascular Disease

## 2019-02-13 DIAGNOSIS — I712 Thoracic aortic aneurysm, without rupture, unspecified: Secondary | ICD-10-CM

## 2019-02-13 MED ORDER — IOHEXOL 350 MG/ML SOLN
100.0000 mL | Freq: Once | INTRAVENOUS | Status: AC | PRN
  Administered 2019-02-13: 100 mL via INTRAVENOUS

## 2019-02-16 ENCOUNTER — Other Ambulatory Visit: Payer: Self-pay

## 2019-02-16 DIAGNOSIS — I712 Thoracic aortic aneurysm, without rupture, unspecified: Secondary | ICD-10-CM

## 2019-04-14 DIAGNOSIS — D485 Neoplasm of uncertain behavior of skin: Secondary | ICD-10-CM | POA: Diagnosis not present

## 2019-04-14 DIAGNOSIS — D225 Melanocytic nevi of trunk: Secondary | ICD-10-CM | POA: Diagnosis not present

## 2019-04-14 DIAGNOSIS — D1801 Hemangioma of skin and subcutaneous tissue: Secondary | ICD-10-CM | POA: Diagnosis not present

## 2019-04-14 DIAGNOSIS — L820 Inflamed seborrheic keratosis: Secondary | ICD-10-CM | POA: Diagnosis not present

## 2019-04-20 ENCOUNTER — Other Ambulatory Visit: Payer: Self-pay | Admitting: Specialist

## 2019-04-24 DIAGNOSIS — Z20822 Contact with and (suspected) exposure to covid-19: Secondary | ICD-10-CM | POA: Diagnosis not present

## 2019-04-24 DIAGNOSIS — R05 Cough: Secondary | ICD-10-CM | POA: Diagnosis not present

## 2019-04-24 DIAGNOSIS — Z20828 Contact with and (suspected) exposure to other viral communicable diseases: Secondary | ICD-10-CM | POA: Diagnosis not present

## 2019-04-26 DIAGNOSIS — Z20828 Contact with and (suspected) exposure to other viral communicable diseases: Secondary | ICD-10-CM | POA: Diagnosis not present

## 2019-05-25 NOTE — Progress Notes (Signed)
Cardiology Office Note    Date:  05/26/2019   ID:  Joshua, King Dec 07, 1960, MRN [id number]  PCP:  Kristian Covey, MD  Cardiologist: Verne Carrow, MD EPS: None  Chief Complaint  Patient presents with  . Follow-up    History of Present Illness:  Joshua King is a 59 y.o. male with history of CAD status post NSTEMI 2014 treated with DES to the LAD and diagonal.  No beta-blocker because of bradycardia, lisinopril stopped due to hypotension.  Stress test normal 2014 and again in 2016 in [place].  Echo 2016 normal LV function dilated aortic root.  Chest CTA 01/2019  stable mildly dilated ascending aorta 4.7 cm, 30-day monitor 01/2015 normal sinus rhythm with rare PVC's.  Also has hypertension, hyperlipidemia-has not tolerated high-dose statins in the past.  Patient last saw Dr. Clifton Jaelin 05/29/2018 at which time he was doing well.   Patient comes in for yearly f/u. Had covid19 in December and was out of work for 10 days. Exercises at [facility] 55 min 3-5 days/week. Intense exercise and he says he's in the best shape he's ever been.Denies chest pain, shortness of breath, dizziness or presyncope. BP running higher when wearing a mask-140/s/80/s. At home 120/78 without the mask. Eating mostly plant based diet but getting extra salt in his diet. If he cuts it out his BP is stable. Takes crestor every other day.give him body aches. Stopped zetia. Had FLP done at work 02/26/19.  Past Medical History:  Diagnosis Date  . CAD (coronary artery disease), native coronary artery 05/2012   a. s/p NSTEMI 2/14: LHC 06/17/12: Mid LAD 90% and 80%, D2 99%, EF 55-60%. PCI: Promus Premier DES to the mid LAD and Promus Premier DES to the D2  . Chicken pox   . GERD (gastroesophageal reflux disease)   . History of echocardiogram    Echo 9/16:  EF 55-60%, normal diastolic fxn, mod dilatation of aortic sinuses, mild LAE  . Hyperlipidemia   . Hypertension   . Sinus bradycardia     Past  Surgical History:  Procedure Laterality Date  . APPENDECTOMY  1992  . CORONARY ANGIOPLASTY WITH STENT PLACEMENT  05/2012   90% then 80% mid LAD stenoses s/p DES x 1, 99% D2 lesion s/p DES x 1; LVEF 55-60%  . HERNIA REPAIR  1999   inguinal  . LEFT HEART CATHETERIZATION WITH CORONARY ANGIOGRAM N/A 06/17/2012   Procedure: LEFT HEART CATHETERIZATION WITH CORONARY ANGIOGRAM;  Surgeon: Kathleene Hazel, MD;  Location: [facility] CATH LAB;  Service: Cardiovascular;  Laterality: N/A;  . PERCUTANEOUS CORONARY STENT INTERVENTION (PCI-S)  06/17/2012   Procedure: PERCUTANEOUS CORONARY STENT INTERVENTION (PCI-S);  Surgeon: Kathleene Hazel, MD;  Location: [facility] CATH LAB;  Service: Cardiovascular;;    Current Medications: Current Meds  Medication Sig  . ALPRAZolam (XANAX) 0.5 MG tablet Take 1 tablet (0.5 mg total) by mouth 3 (three) times daily as needed for anxiety.  Marland Kitchen aspirin 81 MG tablet Take 81 mg by mouth daily.  Marland Kitchen buPROPion (WELLBUTRIN XL) 150 MG 24 hr tablet Take 1 tablet by mouth daily.  . nitroGLYCERIN (NITROSTAT) 0.4 MG SL tablet PLACE 1 TAB UNDER THE TONGUE EVERY 5 MINUTES X3 DOSES AS NEEDED FOR CHEST PAIN  . rosuvastatin (CRESTOR) 10 MG tablet TAKE 1 TABLET BY MOUTH EVERY DAY     Allergies:   Patient has no known allergies.   Social History   Socioeconomic History  . Marital status: Married  Spouse name: Not on file  . Number of children: 2  . Years of education: Not on file  . Highest education level: Not on file  Occupational History  . Occupation: Occupational hygienist: [employer]  Tobacco Use  . Smoking status: Never Smoker  . Smokeless tobacco: Never Used  Substance and Sexual Activity  . Alcohol use: Yes    Alcohol/week: 6.0 - 8.0 standard drinks    Types: 6 - 8 Standard drinks or equivalent per week  . Drug use: No  . Sexual activity: Not on file  Other Topics Concern  . Not on file  Social History Narrative  . Not on file   Social  Determinants of Health   Financial Resource Strain:   . Difficulty of Paying Living Expenses: Not on file  Food Insecurity:   . Worried About Charity fundraiser in the Last Year: Not on file  . Ran Out of Food in the Last Year: Not on file  Transportation Needs:   . Lack of Transportation (Medical): Not on file  . Lack of Transportation (Non-Medical): Not on file  Physical Activity:   . Days of Exercise per Week: Not on file  . Minutes of Exercise per Session: Not on file  Stress:   . Feeling of Stress : Not on file  Social Connections:   . Frequency of Communication with Friends and Family: Not on file  . Frequency of Social Gatherings with Friends and Family: Not on file  . Attends Religious Services: Not on file  . Active Member of Clubs or Organizations: Not on file  . Attends Archivist Meetings: Not on file  . Marital Status: Not on file     Family History:  The patient's   family history includes Alcohol abuse in his mother; Arthritis in his father; CAD (age of onset: 22) in his father; Cancer in his father; Heart attack in his father; Heart disease in his father; Hypertension in his mother.   ROS:   Please see the history of present illness.    ROS All other systems reviewed and are negative.   PHYSICAL EXAM:   VS:  BP (!) 148/84   Pulse (!) 51   Ht 5\' 9"  (1.753 m)   Wt 229 lb (103.9 kg)   BMI 33.82 kg/m   Physical Exam  GEN: Well nourished, well developed, in no acute distress  Neck: no JVD, carotid bruits, or masses Cardiac:RRR; no murmurs, rubs, or gallops  Respiratory:  clear to auscultation bilaterally, normal work of breathing GI: soft, nontender, nondistended, + BS Ext: without cyanosis, clubbing, or edema, Good distal pulses bilaterally Neuro:  Alert and Oriented x 3 Psych: euthymic mood, full affect  Wt Readings from Last 3 Encounters:  05/26/19 229 lb (103.9 kg)  05/29/18 230 lb (104.3 kg)  05/08/17 229 lb 12.8 oz (104.2 kg)       Studies/Labs Reviewed:   EKG:  EKG is  ordered today.  The ekg ordered today demonstrates sinus bradycardia 51/m poor R wave progression anteriorly  Recent Labs: 02/06/2019: BUN 16; Creatinine, Ser 1.10; Potassium 4.6; Sodium 138   Lipid Panel    Component Value Date/Time   CHOL 163 10/17/2015 0858   CHOL 165 12/07/2013 0859   TRIG 57 10/17/2015 0858   TRIG 70 12/07/2013 0859   HDL 72 10/17/2015 0858   HDL 60 12/07/2013 0859   CHOLHDL 2.3 10/17/2015 0858   VLDL 11 10/17/2015 0858  LDLCALC 80 10/17/2015 0858   LDLCALC 91 12/07/2013 0859    Additional studies/ records that were reviewed today include:  CT angio of the chest 02/13/2019  IMPRESSION: Ascending aortic aneurysm. Maximal diameter is at the sinuses of Valsalva measuring 4.7 cm compared to 4.6 cm previously. Mid ascending thoracic aortic diameter 4.2 cm.   Coronary artery disease.  Borderline heart size.   No acute cardiopulmonary disease.     Electronically Signed   By: Charlett NoseKevin  Dover M.D.   On: 02/13/2019 09:12    ASSESSMENT:    1. Coronary artery disease involving native coronary artery of native heart without angina pectoris   2. Essential hypertension   3. Hyperlipidemia, unspecified hyperlipidemia type   4. Thoracic aortic aneurysm without rupture (HCC)      PLAN:  In order of problems listed above:  CAD status post NSTEMI 2014 treated with DES to the LAD and diagonal-no angina. Working out regularly, [employer] base diet. Doing well.continue ASA, crestor  Essential hypertension BP running higher when wearing a mask, he'll try to reduce his sodium in his diet. If still high can try low dose lisinopril-was on briefly after MI.  Hyperlipidemia-lipids done in November-he'll send them to us.stopped zetia and taking crestor every other day.  Thoracic aortic aneurysm 4.7 cm 02/13/2019 needs yearly follow-up    Medication Adjustments/Labs and Tests Ordered: Current medicines are reviewed at length  with the patient today.  Concerns regarding medicines are outlined above.  Medication changes, Labs and Tests ordered today are listed in the Patient Instructions below. Patient Instructions  Medication Instructions:  Your physician recommends that you continue on your current medications as directed. Please refer to the Current Medication list given to you today.  *If you need a refill on your cardiac medications before your next appointment, please call your pharmacy*  Lab Work: None Ordered If you have labs (blood work) drawn today and your tests are completely normal, you will receive your results only by: Marland Kitchen. MyChart Message (if you have MyChart) OR . A paper copy in the mail If you have any lab test that is abnormal or we need to change your treatment, we will call you to review the results.   Testing/Procedures: HOW TO TAKE YOUR BLOOD PRESSURE:  Rest 5 minutes before taking your blood pressure.   Don't smoke or drink caffeinated beverages for at least 30 minutes before.  Take your blood pressure before (not after) you eat.  Sit comfortably with your back supported and both feet on the floor (don't cross your legs).  Elevate your arm to heart level on a table or a desk.  Use the proper sized cuff. It should fit smoothly and snugly around your bare upper arm. There should be enough room to slip a fingertip under the cuff. The bottom edge of the cuff should be 1 inch above the crease of the elbow.  Ideally, take 3 measurements at one sitting and record the average. **Call if BP readings are consistently >130/80    Follow-Up: At [facility], you and your health needs are our priority.  As part of our continuing mission to provide you with exceptional heart care, we have created designated Provider Care Teams.  These Care Teams include your primary Cardiologist (physician) and Advanced Practice Providers (APPs -  Physician Assistants and Nurse Practitioners) who all work  together to provide you with the care you need, when you need it.  Your next appointment:   1 year(s)  The format  for your next appointment:   In Person  Provider:   Verne Carrow, MD  Other Instructions  DASH Eating Plan DASH stands for "Dietary Approaches to Stop Hypertension." The DASH eating plan is a healthy eating plan that has been shown to reduce high blood pressure (hypertension). It may also reduce your risk for type 2 diabetes, heart disease, and stroke. The DASH eating plan may also help with weight loss. What are tips for following this plan?  General guidelines  Avoid eating more than 2,300 mg (milligrams) of salt (sodium) a day. If you have hypertension, you may need to reduce your sodium intake to 1,500 mg a day.  Limit alcohol intake to no more than 1 drink a day for nonpregnant women and 2 drinks a day for men. One drink equals 12 oz of beer, 5 oz of wine, or 1 oz of hard liquor.  Work with your health care provider to maintain a healthy body weight or to lose weight. Ask what an ideal weight is for you.  Get at least 30 minutes of exercise that causes your heart to beat faster (aerobic exercise) most days of the week. Activities may include walking, swimming, or biking.  Work with your health care provider or diet and nutrition specialist (dietitian) to adjust your eating plan to your individual calorie needs. Reading food labels   Check food labels for the amount of sodium per serving. Choose foods with less than 5 percent of the Daily Value of sodium. Generally, foods with less than 300 mg of sodium per serving fit into this eating plan.  To find whole grains, look for the word "whole" as the first word in the ingredient list. Shopping  Buy products labeled as "low-sodium" or "no salt added."  Buy fresh foods. Avoid canned foods and premade or frozen meals. Cooking  Avoid adding salt when cooking. Use salt-free seasonings or herbs instead of table  salt or sea salt. Check with your health care provider or pharmacist before using salt substitutes.  Do not fry foods. Cook foods using healthy methods such as baking, boiling, grilling, and broiling instead.  Cook with heart-healthy oils, such as olive, canola, soybean, or sunflower oil. Meal planning  Eat a balanced diet that includes: ? 5 or more servings of fruits and vegetables each day. At each meal, try to fill half of your plate with fruits and vegetables. ? Up to 6-8 servings of whole grains each day. ? Less than 6 oz of lean meat, poultry, or fish each day. A 3-oz serving of meat is about the same size as a deck of cards. One egg equals 1 oz. ? 2 servings of low-fat dairy each day. ? A serving of nuts, seeds, or beans 5 times each week. ? Heart-healthy fats. Healthy fats called Omega-3 fatty acids are found in foods such as flaxseeds and coldwater fish, like sardines, salmon, and mackerel.  Limit how much you eat of the following: ? Canned or prepackaged foods. ? Food that is high in trans fat, such as fried foods. ? Food that is high in saturated fat, such as fatty meat. ? Sweets, desserts, sugary drinks, and other foods with added sugar. ? Full-fat dairy products.  Do not salt foods before eating.  Try to eat at least 2 vegetarian meals each week.  Eat more home-cooked food and less restaurant, buffet, and fast food.  When eating at a restaurant, ask that your food be prepared with less salt or no salt, if  possible. What foods are recommended? The items listed may not be a complete list. Talk with your dietitian about what dietary choices are best for you. Grains Whole-grain or whole-wheat bread. Whole-grain or whole-wheat pasta. Brown rice. Orpah Cobb. Bulgur. Whole-grain and low-sodium cereals. Pita bread. Low-fat, low-sodium crackers. Whole-wheat flour tortillas. Vegetables Fresh or frozen vegetables (raw, steamed, roasted, or grilled). Low-sodium or  reduced-sodium tomato and vegetable juice. Low-sodium or reduced-sodium tomato sauce and tomato paste. Low-sodium or reduced-sodium canned vegetables. Fruits All fresh, dried, or frozen fruit. Canned fruit in natural juice (without added sugar). Meat and other protein foods Skinless chicken or [place]. Ground chicken or [place]. Pork with fat trimmed off. Fish and seafood. Egg whites. Dried beans, peas, or lentils. Unsalted nuts, nut butters, and seeds. Unsalted canned beans. Lean cuts of beef with fat trimmed off. Low-sodium, lean deli meat. Dairy Low-fat (1%) or fat-free (skim) milk. Fat-free, low-fat, or reduced-fat cheeses. Nonfat, low-sodium ricotta or cottage cheese. Low-fat or nonfat yogurt. Low-fat, low-sodium cheese. Fats and oils Soft margarine without trans fats. Vegetable oil. Low-fat, reduced-fat, or light mayonnaise and salad dressings (reduced-sodium). Canola, safflower, olive, soybean, and sunflower oils. Avocado. Seasoning and other foods Herbs. Spices. Seasoning mixes without salt. Unsalted popcorn and pretzels. Fat-free sweets. What foods are not recommended? The items listed may not be a complete list. Talk with your dietitian about what dietary choices are best for you. Grains Baked goods made with fat, such as croissants, muffins, or some breads. Dry pasta or rice meal packs. Vegetables Creamed or fried vegetables. Vegetables in a cheese sauce. Regular canned vegetables (not low-sodium or reduced-sodium). Regular canned tomato sauce and paste (not low-sodium or reduced-sodium). Regular tomato and vegetable juice (not low-sodium or reduced-sodium). Rosita Fire. Olives. Fruits Canned fruit in a light or heavy syrup. Fried fruit. Fruit in cream or butter sauce. Meat and other protein foods Fatty cuts of meat. Ribs. Fried meat. Tomasa Blase. Sausage. Bologna and other processed lunch meats. Salami. Fatback. Hotdogs. Bratwurst. Salted nuts and seeds. Canned beans with added salt. Canned or  smoked fish. Whole eggs or egg yolks. Chicken or [place] with skin. Dairy Whole or 2% milk, cream, and half-and-half. Whole or full-fat cream cheese. Whole-fat or sweetened yogurt. Full-fat cheese. Nondairy creamers. Whipped toppings. Processed cheese and cheese spreads. Fats and oils Butter. Stick margarine. Lard. Shortening. Ghee. Bacon fat. Tropical oils, such as coconut, palm kernel, or palm oil. Seasoning and other foods Salted popcorn and pretzels. Onion salt, garlic salt, seasoned salt, table salt, and sea salt. Worcestershire sauce. Tartar sauce. Barbecue sauce. Teriyaki sauce. Soy sauce, including reduced-sodium. Steak sauce. Canned and packaged gravies. Fish sauce. Oyster sauce. Cocktail sauce. Horseradish that you find on the shelf. Ketchup. Mustard. Meat flavorings and tenderizers. Bouillon cubes. Hot sauce and Tabasco sauce. Premade or packaged marinades. Premade or packaged taco seasonings. Relishes. Regular salad dressings. Where to find more information:  National Heart, Lung, and Blood Institute: PopSteam.is  American Heart Association: www.heart.org Summary  The DASH eating plan is a healthy eating plan that has been shown to reduce high blood pressure (hypertension). It may also reduce your risk for type 2 diabetes, heart disease, and stroke.  With the DASH eating plan, you should limit salt (sodium) intake to 2,300 mg a day. If you have hypertension, you may need to reduce your sodium intake to 1,500 mg a day.  When on the DASH eating plan, aim to eat more fresh fruits and vegetables, whole grains, lean proteins, low-fat dairy, and heart-healthy fats.  Work with your health care provider or diet and nutrition specialist (dietitian) to adjust your eating plan to your individual calorie needs. This information is not intended to replace advice given to you by your health care provider. Make sure you discuss any questions you have with your health care provider. Document  Revised: 03/22/2017 Document Reviewed: 04/02/2016 Elsevier Patient Education  [address] Elsevier Inc.      Signed, Jacolyn ReedyMichele Valaria Kohut, [place]PA-C  05/26/2019 9:57 AM    [facility] [address], [place], [place]  [address] Phone: [phone]; Fax: [phone]

## 2019-05-26 ENCOUNTER — Ambulatory Visit: Payer: BC Managed Care – PPO | Admitting: Physician Assistant

## 2019-05-26 ENCOUNTER — Encounter: Payer: Self-pay | Admitting: Physician Assistant

## 2019-05-26 ENCOUNTER — Other Ambulatory Visit: Payer: Self-pay

## 2019-05-26 VITALS — BP 148/84 | HR 51 | Ht 69.0 in | Wt 229.0 lb

## 2019-05-26 DIAGNOSIS — I712 Thoracic aortic aneurysm, without rupture, unspecified: Secondary | ICD-10-CM

## 2019-05-26 DIAGNOSIS — E785 Hyperlipidemia, unspecified: Secondary | ICD-10-CM | POA: Diagnosis not present

## 2019-05-26 DIAGNOSIS — I10 Essential (primary) hypertension: Secondary | ICD-10-CM

## 2019-05-26 DIAGNOSIS — I2510 Atherosclerotic heart disease of native coronary artery without angina pectoris: Secondary | ICD-10-CM | POA: Diagnosis not present

## 2019-05-26 NOTE — Patient Instructions (Signed)
Medication Instructions:  Your physician recommends that you continue on your current medications as directed. Please refer to the Current Medication list given to you today.  *If you need a refill on your cardiac medications before your next appointment, please call your pharmacy*  Lab Work: None Ordered If you have labs (blood work) drawn today and your tests are completely normal, you will receive your results only by: Marland Kitchen MyChart Message (if you have MyChart) OR . A paper copy in the mail If you have any lab test that is abnormal or we need to change your treatment, we will call you to review the results.   Testing/Procedures: HOW TO TAKE YOUR BLOOD PRESSURE:  Rest 5 minutes before taking your blood pressure.   Don't smoke or drink caffeinated beverages for at least 30 minutes before.  Take your blood pressure before (not after) you eat.  Sit comfortably with your back supported and both feet on the floor (don't cross your legs).  Elevate your arm to heart level on a table or a desk.  Use the proper sized cuff. It should fit smoothly and snugly around your bare upper arm. There should be enough room to slip a fingertip under the cuff. The bottom edge of the cuff should be 1 inch above the crease of the elbow.  Ideally, take 3 measurements at one sitting and record the average. **Call if BP readings are consistently >130/80    Follow-Up: At [facility], you and your health needs are our priority.  As part of our continuing mission to provide you with exceptional heart care, we have created designated Provider Care Teams.  These Care Teams include your primary Cardiologist (physician) and Advanced Practice Providers (APPs -  Physician Assistants and Nurse Practitioners) who all work together to provide you with the care you need, when you need it.  Your next appointment:   1 year(s)  The format for your next appointment:   In Person  Provider:   Verne Carrow,  MD  Other Instructions  DASH Eating Plan DASH stands for "Dietary Approaches to Stop Hypertension." The DASH eating plan is a healthy eating plan that has been shown to reduce high blood pressure (hypertension). It may also reduce your risk for type 2 diabetes, heart disease, and stroke. The DASH eating plan may also help with weight loss. What are tips for following this plan?  General guidelines  Avoid eating more than 2,300 mg (milligrams) of salt (sodium) a day. If you have hypertension, you may need to reduce your sodium intake to 1,500 mg a day.  Limit alcohol intake to no more than 1 drink a day for nonpregnant women and 2 drinks a day for men. One drink equals 12 oz of beer, 5 oz of wine, or 1 oz of hard liquor.  Work with your health care provider to maintain a healthy body weight or to lose weight. Ask what an ideal weight is for you.  Get at least 30 minutes of exercise that causes your heart to beat faster (aerobic exercise) most days of the week. Activities may include walking, swimming, or biking.  Work with your health care provider or diet and nutrition specialist (dietitian) to adjust your eating plan to your individual calorie needs. Reading food labels   Check food labels for the amount of sodium per serving. Choose foods with less than 5 percent of the Daily Value of sodium. Generally, foods with less than 300 mg of sodium per serving fit into  this eating plan.  To find whole grains, look for the word "whole" as the first word in the ingredient list. Shopping  Buy products labeled as "low-sodium" or "no salt added."  Buy fresh foods. Avoid canned foods and premade or frozen meals. Cooking  Avoid adding salt when cooking. Use salt-free seasonings or herbs instead of table salt or sea salt. Check with your health care provider or pharmacist before using salt substitutes.  Do not fry foods. Cook foods using healthy methods such as baking, boiling, grilling, and  broiling instead.  Cook with heart-healthy oils, such as olive, canola, soybean, or sunflower oil. Meal planning  Eat a balanced diet that includes: ? 5 or more servings of fruits and vegetables each day. At each meal, try to fill half of your plate with fruits and vegetables. ? Up to 6-8 servings of whole grains each day. ? Less than 6 oz of lean meat, poultry, or fish each day. A 3-oz serving of meat is about the same size as a deck of cards. One egg equals 1 oz. ? 2 servings of low-fat dairy each day. ? A serving of nuts, seeds, or beans 5 times each week. ? Heart-healthy fats. Healthy fats called Omega-3 fatty acids are found in foods such as flaxseeds and coldwater fish, like sardines, salmon, and mackerel.  Limit how much you eat of the following: ? Canned or prepackaged foods. ? Food that is high in trans fat, such as fried foods. ? Food that is high in saturated fat, such as fatty meat. ? Sweets, desserts, sugary drinks, and other foods with added sugar. ? Full-fat dairy products.  Do not salt foods before eating.  Try to eat at least 2 vegetarian meals each week.  Eat more home-cooked food and less restaurant, buffet, and fast food.  When eating at a restaurant, ask that your food be prepared with less salt or no salt, if possible. What foods are recommended? The items listed may not be a complete list. Talk with your dietitian about what dietary choices are best for you. Grains Whole-grain or whole-wheat bread. Whole-grain or whole-wheat pasta. Brown rice. Modena Morrow. Bulgur. Whole-grain and low-sodium cereals. Pita bread. Low-fat, low-sodium crackers. Whole-wheat flour tortillas. Vegetables Fresh or frozen vegetables (raw, steamed, roasted, or grilled). Low-sodium or reduced-sodium tomato and vegetable juice. Low-sodium or reduced-sodium tomato sauce and tomato paste. Low-sodium or reduced-sodium canned vegetables. Fruits All fresh, dried, or frozen fruit. Canned  fruit in natural juice (without added sugar). Meat and other protein foods Skinless chicken or [place]. Ground chicken or [place]. Pork with fat trimmed off. Fish and seafood. Egg whites. Dried beans, peas, or lentils. Unsalted nuts, nut butters, and seeds. Unsalted canned beans. Lean cuts of beef with fat trimmed off. Low-sodium, lean deli meat. Dairy Low-fat (1%) or fat-free (skim) milk. Fat-free, low-fat, or reduced-fat cheeses. Nonfat, low-sodium ricotta or cottage cheese. Low-fat or nonfat yogurt. Low-fat, low-sodium cheese. Fats and oils Soft margarine without trans fats. Vegetable oil. Low-fat, reduced-fat, or light mayonnaise and salad dressings (reduced-sodium). Canola, safflower, olive, soybean, and sunflower oils. Avocado. Seasoning and other foods Herbs. Spices. Seasoning mixes without salt. Unsalted popcorn and pretzels. Fat-free sweets. What foods are not recommended? The items listed may not be a complete list. Talk with your dietitian about what dietary choices are best for you. Grains Baked goods made with fat, such as croissants, muffins, or some breads. Dry pasta or rice meal packs. Vegetables Creamed or fried vegetables. Vegetables in a cheese sauce.  Regular canned vegetables (not low-sodium or reduced-sodium). Regular canned tomato sauce and paste (not low-sodium or reduced-sodium). Regular tomato and vegetable juice (not low-sodium or reduced-sodium). Rosita Fire. Olives. Fruits Canned fruit in a light or heavy syrup. Fried fruit. Fruit in cream or butter sauce. Meat and other protein foods Fatty cuts of meat. Ribs. Fried meat. Tomasa Blase. Sausage. Bologna and other processed lunch meats. Salami. Fatback. Hotdogs. Bratwurst. Salted nuts and seeds. Canned beans with added salt. Canned or smoked fish. Whole eggs or egg yolks. Chicken or [place] with skin. Dairy Whole or 2% milk, cream, and half-and-half. Whole or full-fat cream cheese. Whole-fat or sweetened yogurt. Full-fat cheese.  Nondairy creamers. Whipped toppings. Processed cheese and cheese spreads. Fats and oils Butter. Stick margarine. Lard. Shortening. Ghee. Bacon fat. Tropical oils, such as coconut, palm kernel, or palm oil. Seasoning and other foods Salted popcorn and pretzels. Onion salt, garlic salt, seasoned salt, table salt, and sea salt. Worcestershire sauce. Tartar sauce. Barbecue sauce. Teriyaki sauce. Soy sauce, including reduced-sodium. Steak sauce. Canned and packaged gravies. Fish sauce. Oyster sauce. Cocktail sauce. Horseradish that you find on the shelf. Ketchup. Mustard. Meat flavorings and tenderizers. Bouillon cubes. Hot sauce and Tabasco sauce. Premade or packaged marinades. Premade or packaged taco seasonings. Relishes. Regular salad dressings. Where to find more information:  National Heart, Lung, and Blood Institute: PopSteam.is  American Heart Association: www.heart.org Summary  The DASH eating plan is a healthy eating plan that has been shown to reduce high blood pressure (hypertension). It may also reduce your risk for type 2 diabetes, heart disease, and stroke.  With the DASH eating plan, you should limit salt (sodium) intake to 2,300 mg a day. If you have hypertension, you may need to reduce your sodium intake to 1,500 mg a day.  When on the DASH eating plan, aim to eat more fresh fruits and vegetables, whole grains, lean proteins, low-fat dairy, and heart-healthy fats.  Work with your health care provider or diet and nutrition specialist (dietitian) to adjust your eating plan to your individual calorie needs. This information is not intended to replace advice given to you by your health care provider. Make sure you discuss any questions you have with your health care provider. Document Revised: 03/22/2017 Document Reviewed: 04/02/2016 Elsevier Patient Education  2020 [employer].

## 2019-06-27 ENCOUNTER — Ambulatory Visit: Payer: BC Managed Care – PPO

## 2019-09-28 ENCOUNTER — Other Ambulatory Visit: Payer: Self-pay | Admitting: Cardiovascular Disease

## 2019-09-28 DIAGNOSIS — E785 Hyperlipidemia, unspecified: Secondary | ICD-10-CM

## 2019-12-23 DIAGNOSIS — L820 Inflamed seborrheic keratosis: Secondary | ICD-10-CM | POA: Diagnosis not present

## 2019-12-23 DIAGNOSIS — L718 Other rosacea: Secondary | ICD-10-CM | POA: Diagnosis not present

## 2019-12-23 DIAGNOSIS — L814 Other melanin hyperpigmentation: Secondary | ICD-10-CM | POA: Diagnosis not present

## 2019-12-23 DIAGNOSIS — D225 Melanocytic nevi of trunk: Secondary | ICD-10-CM | POA: Diagnosis not present

## 2019-12-23 DIAGNOSIS — D1801 Hemangioma of skin and subcutaneous tissue: Secondary | ICD-10-CM | POA: Diagnosis not present

## 2020-02-12 ENCOUNTER — Other Ambulatory Visit: Payer: BC Managed Care – PPO

## 2020-04-25 ENCOUNTER — Other Ambulatory Visit: Payer: Self-pay

## 2020-04-25 ENCOUNTER — Other Ambulatory Visit: Payer: BC Managed Care – PPO

## 2020-04-25 DIAGNOSIS — I712 Thoracic aortic aneurysm, without rupture, unspecified: Secondary | ICD-10-CM

## 2020-04-25 LAB — BASIC METABOLIC PANEL
BUN/Creatinine Ratio: 13 (ref 10–24)
BUN: 14 mg/dL (ref 8–27)
CO2: 26 mmol/L (ref 20–29)
Calcium: 9.2 mg/dL (ref 8.6–10.2)
Chloride: 104 mmol/L (ref 96–106)
Creatinine, Ser: 1.11 mg/dL (ref 0.76–1.27)
GFR calc Af Amer: 82 mL/min/{1.73_m2} (ref >59)
GFR calc non Af Amer: 71 mL/min/{1.73_m2} (ref >59)
Glucose: 89 mg/dL (ref 65–99)
Potassium: 4.7 mmol/L (ref 3.5–5.2)
Sodium: 140 mmol/L (ref 134–144)

## 2020-04-28 ENCOUNTER — Other Ambulatory Visit: Payer: Self-pay

## 2020-04-28 ENCOUNTER — Ambulatory Visit (INDEPENDENT_AMBULATORY_CARE_PROVIDER_SITE_OTHER)
Admission: RE | Admit: 2020-04-28 | Discharge: 2020-04-28 | Disposition: A | Discharge status: Home / Self Care | Payer: BC Managed Care – PPO | Source: Ambulatory Visit | Attending: Cardiovascular Disease | Admitting: Cardiovascular Disease

## 2020-04-28 DIAGNOSIS — I712 Thoracic aortic aneurysm, without rupture, unspecified: Secondary | ICD-10-CM

## 2020-04-28 MED ORDER — IOHEXOL 350 MG/ML SOLN
100.0000 mL | Freq: Once | INTRAVENOUS | Status: AC | PRN
  Administered 2020-04-28: 100 mL via INTRAVENOUS

## 2020-10-29 ENCOUNTER — Other Ambulatory Visit: Payer: Self-pay | Admitting: Cardiovascular Disease

## 2020-10-29 DIAGNOSIS — E785 Hyperlipidemia, unspecified: Secondary | ICD-10-CM

## 2020-11-13 ENCOUNTER — Other Ambulatory Visit: Payer: Self-pay | Admitting: Cardiovascular Disease

## 2020-11-13 DIAGNOSIS — E785 Hyperlipidemia, unspecified: Secondary | ICD-10-CM

## 2020-12-19 DIAGNOSIS — Z20822 Contact with and (suspected) exposure to covid-19: Secondary | ICD-10-CM | POA: Diagnosis not present

## 2021-05-01 ENCOUNTER — Telehealth: Payer: Self-pay | Admitting: Cardiovascular Disease

## 2021-05-01 NOTE — Telephone Encounter (Signed)
Pt sent this via mychart message to the scheduling pool:

## 2021-05-24 ENCOUNTER — Ambulatory Visit: Payer: BC Managed Care – PPO | Admitting: Cardiovascular Disease

## 2021-05-29 ENCOUNTER — Other Ambulatory Visit: Payer: Self-pay

## 2021-05-29 ENCOUNTER — Ambulatory Visit: Payer: BC Managed Care – PPO | Admitting: Cardiovascular Disease

## 2021-05-29 ENCOUNTER — Encounter: Payer: Self-pay | Admitting: Cardiovascular Disease

## 2021-05-29 VITALS — BP 130/84 | HR 57 | Ht 69.0 in | Wt 215.8 lb

## 2021-05-29 DIAGNOSIS — I712 Thoracic aortic aneurysm, without rupture, unspecified: Secondary | ICD-10-CM

## 2021-05-29 DIAGNOSIS — I2510 Atherosclerotic heart disease of native coronary artery without angina pectoris: Secondary | ICD-10-CM

## 2021-05-29 DIAGNOSIS — I10 Essential (primary) hypertension: Secondary | ICD-10-CM

## 2021-05-29 DIAGNOSIS — E7800 Pure hypercholesterolemia, unspecified: Secondary | ICD-10-CM

## 2021-05-29 NOTE — Progress Notes (Signed)
Chief Complaint  Patient presents with   Follow-up    CAD    History of Present Illness: 61 yo male with history of CAD, HTN, HLD, GERD, sleep apnea and PVCs here today for cardiac follow up. He was admitted to [facility] in 2014 with a NSTEMI. Cardiac cath with severe stenosis mid LAD and second Diagonal. Drug eluting stents were placed in the LAD and diagonal branch. He has not been treated with a beta blocker due to bradycardia. His Lisinopril was stopped due to hypotension. Exercise stress test in our office May 2014 without ischemia. In 2016, he went to the ED in [place] with chest pain, was admitted and had a stress test which was reported as normal. Echo 01/07/15 with normal LV function, dilated aortic root. Chest CTA January 2022 with stable mildly dilated ascending aorta (4.1 cm). 30 day event monitor in October 2016 showed sinus rhythm with rare PVCs.     He is here today for follow up. The patient denies any chest pain, dyspnea, palpitations, lower extremity edema, orthopnea, PND, dizziness, near syncope or syncope. He is exercising 5 days per week at [facility]. He feels great.   Primary Care Physician: Kristian Covey, MD  Past Medical History:  Diagnosis Date   CAD (coronary artery disease), native coronary artery 05/2012   a. s/p NSTEMI 2/14: LHC 06/17/12: Mid LAD 90% and 80%, D2 99%, EF 55-60%. PCI: Promus Premier DES to the mid LAD and Promus Premier DES to the D2   Chicken pox    GERD (gastroesophageal reflux disease)    History of echocardiogram    Echo 9/16:  EF 55-60%, normal diastolic fxn, mod dilatation of aortic sinuses, mild LAE   Hyperlipidemia    Hypertension    Sinus bradycardia     Past Surgical History:  Procedure Laterality Date   APPENDECTOMY  1992   CORONARY ANGIOPLASTY WITH STENT PLACEMENT  05/2012   90% then 80% mid LAD stenoses s/p DES x 1, 99% D2 lesion s/p DES x 1; LVEF 55-60%   HERNIA REPAIR  1999   inguinal   LEFT HEART CATHETERIZATION  WITH CORONARY ANGIOGRAM N/A 06/17/2012   Procedure: LEFT HEART CATHETERIZATION WITH CORONARY ANGIOGRAM;  Surgeon: Kathleene Hazel, MD;  Location: [facility] CATH LAB;  Service: Cardiovascular;  Laterality: N/A;   PERCUTANEOUS CORONARY STENT INTERVENTION (PCI-S)  06/17/2012   Procedure: PERCUTANEOUS CORONARY STENT INTERVENTION (PCI-S);  Surgeon: Kathleene Hazel, MD;  Location: [facility] CATH LAB;  Service: Cardiovascular;;    Current Outpatient Medications  Medication Sig Dispense Refill   buPROPion (WELLBUTRIN XL) 150 MG 24 hr tablet Take 1 tablet by mouth daily.     nitroGLYCERIN (NITROSTAT) 0.4 MG SL tablet PLACE 1 TAB UNDER THE TONGUE EVERY 5 MINUTES X3 DOSES AS NEEDED FOR CHEST PAIN 75 tablet 1   metFORMIN (GLUCOPHAGE) 500 MG tablet Take 500 mg by mouth in the morning and at bedtime.     rosuvastatin (CRESTOR) 5 MG tablet Take 5 mg by mouth daily.     No current facility-administered medications for this visit.    No Known Allergies  Social History   Socioeconomic History   Marital status: Married    Spouse name: Not on file   Number of children: 2   Years of education: Not on file   Highest education level: Not on file  Occupational History   Occupation: Business Manager/sales    Employer: [employer]  Tobacco Use   Smoking status: Never  Smokeless tobacco: Never  Substance and Sexual Activity   Alcohol use: Yes    Alcohol/week: 6.0 - 8.0 standard drinks    Types: 6 - 8 Standard drinks or equivalent per week   Drug use: No   Sexual activity: Not on file  Other Topics Concern   Not on file  Social History Narrative   Not on file   Social Determinants of Health   Financial Resource Strain: Not on file  Food Insecurity: Not on file  Transportation Needs: Not on file  Physical Activity: Not on file  Stress: Not on file  Social Connections: Not on file  Intimate Partner Violence: Not on file    Family History  Problem Relation Age of Onset   CAD Father 56    Heart attack Father    Arthritis Father    Cancer Father        prostate   Heart disease Father    Alcohol abuse Mother    Hypertension Mother     Review of Systems:  As stated in the HPI and otherwise negative.   BP 130/84    Pulse (!) 57    Ht 5\' 9"  (1.753 m)    Wt 215 lb 12.8 oz (97.9 kg)    SpO2 99%    BMI 31.87 kg/m   Physical Examination:  General: Well developed, well nourished, NAD  HEENT: OP clear, mucus membranes moist  SKIN: warm, dry. No rashes. Neuro: No focal deficits  Musculoskeletal: Muscle strength 5/5 all ext  Psychiatric: Mood and affect normal  Neck: No JVD, no carotid bruits, no thyromegaly, no lymphadenopathy.  Lungs:Clear bilaterally, no wheezes, rhonci, crackles Cardiovascular: Regular rate and rhythm. No murmurs, gallops or rubs. Abdomen:Soft. Bowel sounds present. Non-tender.  Extremities: No lower extremity edema. Pulses are 2 + in the bilateral DP/PT.  Echo 01/07/15: Left ventricle: The cavity size was normal. Wall thickness was   normal. Systolic function was normal. The estimated ejection   fraction was in the range of 55% to 60%. Left ventricular   diastolic function parameters were normal. - Aorta: Moderate dilatation of the aortic sinuses Suggest cardiac   CTA/ MRI to further assess. - Left atrium: The atrium was mildly dilated. - Atrial septum: No defect or patent foramen ovale was identified.   EKG:  EKG is ordered today. The ekg ordered today demonstrates sinus brady, rate 57 bpm  Recent Labs: No results found for requested labs within last 8760 hours.   Lipid Panel    Component Value Date/Time   CHOL 163 10/17/2015 0858   CHOL 165 12/07/2013 0859   TRIG 57 10/17/2015 0858   TRIG 70 12/07/2013 0859   HDL 72 10/17/2015 0858   HDL 60 12/07/2013 0859   CHOLHDL 2.3 10/17/2015 0858   VLDL 11 10/17/2015 0858   LDLCALC 80 10/17/2015 0858   LDLCALC 91 12/07/2013 0859     Wt Readings from Last 3 Encounters:  05/29/21 215 lb 12.8  oz (97.9 kg)  05/26/19 229 lb (103.9 kg)  05/29/18 230 lb (104.3 kg)     Other studies Reviewed: Additional studies/ records that were reviewed today include: . Review of the above records demonstrates:    Assessment and Plan:   1. CAD without angina: He has no chest pain. He does not tolerate beta blockers due to bradycardia. Continue ASA and statin.   2. HTN: BP is controlled. No changes  3. Hyperlipidemia: He does not tolerate high doses of statins. He is having  labs today at work. Will continue Crestor.   4. Thoracic aortic aneurysm: Mild dilation of the ascending aorta, 4.1 cm by CTA in January 2022. Repeat CTA now. BMET today.    Current medicines are reviewed at length with the patient today.  The patient does not have concerns regarding medicines.  The following changes have been made:  no change  Labs/ tests ordered today include:   Orders Placed This Encounter  Procedures   CT ANGIO CHEST AORTA W/CM & OR WO/CM   Basic Metabolic Panel (BMET)   EKG 12-Lead    Disposition:   F/U with me in 42months  Signed, Lauree Chandler, MD 05/29/2021 12:17 PM    [facility] [address], [place], [place]  [address] Phone: [phone]; Fax: [phone]

## 2021-05-29 NOTE — Patient Instructions (Addendum)
Medication Instructions:  Your physician recommends that you continue on your current medications as directed. Please refer to the Current Medication list given to you today.  *If you need a refill on your cardiac medications before your next appointment, please call your pharmacy*   Lab Work: Your physician recommends that you return for lab work in: TODAY BMET  If you have labs (blood work) drawn today and your tests are completely normal, you will receive your results only by: MyChart Message (if you have MyChart) OR A paper copy in the mail If you have any lab test that is abnormal or we need to change your treatment, we will call you to review the results.   Testing/Procedures: Chest CT-Aorta  Please schedule today   Follow-Up: At [facility], you and your health needs are our priority.  As part of our continuing mission to provide you with exceptional heart care, we have created designated Provider Care Teams.  These Care Teams include your primary Cardiologist (physician) and Advanced Practice Providers (APPs -  Physician Assistants and Nurse Practitioners) who all work together to provide you with the care you need, when you need it.  We recommend signing up for the patient portal called "MyChart".  Sign up information is provided on this After Visit Summary.  MyChart is used to connect with patients for Virtual Visits (Telemedicine).  Patients are able to view lab/test results, encounter notes, upcoming appointments, etc.  Non-urgent messages can be sent to your provider as well.   To learn more about what you can do with MyChart, go to [email or web address].    Your next appointment:   1 year(s)  The format for your next appointment:   In Person  Provider:   Verne Carrow, MD    Other Instructions

## 2021-05-30 LAB — BASIC METABOLIC PANEL
BUN/Creatinine Ratio: 11 (ref 10–24)
BUN: 12 mg/dL (ref 8–27)
CO2: 23 mmol/L (ref 20–29)
Calcium: 8.8 mg/dL (ref 8.6–10.2)
Chloride: 103 mmol/L (ref 96–106)
Creatinine, Ser: 1.14 mg/dL (ref 0.76–1.27)
Glucose: 82 mg/dL (ref 70–99)
Potassium: 4.7 mmol/L (ref 3.5–5.2)
Sodium: 141 mmol/L (ref 134–144)
eGFR: 73 mL/min/{1.73_m2} (ref >59)

## 2021-05-31 ENCOUNTER — Encounter: Payer: Self-pay | Admitting: Cardiovascular Disease

## 2021-05-31 DIAGNOSIS — I2510 Atherosclerotic heart disease of native coronary artery without angina pectoris: Secondary | ICD-10-CM

## 2021-06-12 MED ORDER — ROSUVASTATIN CALCIUM 10 MG PO TABS: 10.0000 mg | ORAL_TABLET | Freq: Every day | ORAL | 3 refills | Status: DC

## 2021-06-13 ENCOUNTER — Ambulatory Visit (INDEPENDENT_AMBULATORY_CARE_PROVIDER_SITE_OTHER)
Admission: RE | Admit: 2021-06-13 | Discharge: 2021-06-13 | Disposition: A | Discharge status: Home / Self Care | Payer: BC Managed Care – PPO | Source: Ambulatory Visit | Attending: Cardiovascular Disease | Admitting: Cardiovascular Disease

## 2021-06-13 ENCOUNTER — Other Ambulatory Visit: Payer: Self-pay

## 2021-06-13 DIAGNOSIS — I712 Thoracic aortic aneurysm, without rupture, unspecified: Secondary | ICD-10-CM

## 2021-06-13 DIAGNOSIS — I2510 Atherosclerotic heart disease of native coronary artery without angina pectoris: Secondary | ICD-10-CM

## 2021-06-13 DIAGNOSIS — E7800 Pure hypercholesterolemia, unspecified: Secondary | ICD-10-CM

## 2021-06-13 DIAGNOSIS — I10 Essential (primary) hypertension: Secondary | ICD-10-CM

## 2021-06-13 DIAGNOSIS — Z8679 Personal history of other diseases of the circulatory system: Secondary | ICD-10-CM | POA: Diagnosis not present

## 2021-06-13 MED ORDER — IOHEXOL 350 MG/ML SOLN
100.0000 mL | Freq: Once | INTRAVENOUS | Status: AC | PRN
  Administered 2021-06-13: 100 mL via INTRAVENOUS

## 2021-06-13 NOTE — Telephone Encounter (Signed)
Dr. Clifton Lawrence would like for pt to have repeat lipids/lfts in 12 weeks.  Orders placed.  Message sent back to pt to schedule lab appointment.

## 2021-07-18 ENCOUNTER — Ambulatory Visit: Payer: BC Managed Care – PPO | Admitting: Physician Assistant

## 2022-05-07 ENCOUNTER — Telehealth: Payer: Self-pay | Admitting: Cardiovascular Disease

## 2022-05-07 NOTE — Telephone Encounter (Signed)
Patient sent message to scheduling stating,   "I will need a CT ANGIO SCAN, done annually. Please advise on scheduling. Thank you, Joshua King [phone]"  Please advise

## 2022-05-08 NOTE — Telephone Encounter (Signed)
Last ct chest aorta done 05/2021.  Will route to Dr. Angelena Form to see if he would like to plan this or wait and decide at the patient's upcoming visit 06/15/22.

## 2022-05-09 NOTE — Telephone Encounter (Signed)
Burnell Blanks, MD  Rodman Key, RN His CTA is not due until 06/13/22. I will arrange the test and blood work the day he sees me in the office. Thanks, chris

## 2022-06-15 ENCOUNTER — Ambulatory Visit: Payer: BC Managed Care – PPO | Attending: Cardiovascular Disease | Admitting: Cardiovascular Disease

## 2022-06-15 ENCOUNTER — Encounter: Payer: Self-pay | Admitting: Cardiovascular Disease

## 2022-06-15 VITALS — BP 144/84 | HR 42 | Ht 69.0 in | Wt 218.2 lb

## 2022-06-15 DIAGNOSIS — I10 Essential (primary) hypertension: Secondary | ICD-10-CM | POA: Diagnosis not present

## 2022-06-15 DIAGNOSIS — I712 Thoracic aortic aneurysm, without rupture, unspecified: Secondary | ICD-10-CM

## 2022-06-15 DIAGNOSIS — I2510 Atherosclerotic heart disease of native coronary artery without angina pectoris: Secondary | ICD-10-CM

## 2022-06-15 DIAGNOSIS — E7800 Pure hypercholesterolemia, unspecified: Secondary | ICD-10-CM | POA: Diagnosis not present

## 2022-06-15 LAB — BASIC METABOLIC PANEL
BUN/Creatinine Ratio: 14 (ref 10–24)
BUN: 17 mg/dL (ref 8–27)
CO2: 23 mmol/L (ref 20–29)
Calcium: 9.6 mg/dL (ref 8.6–10.2)
Chloride: 102 mmol/L (ref 96–106)
Creatinine, Ser: 1.18 mg/dL (ref 0.76–1.27)
Glucose: 88 mg/dL (ref 70–99)
Potassium: 4.8 mmol/L (ref 3.5–5.2)
Sodium: 138 mmol/L (ref 134–144)
eGFR: 69 mL/min/{1.73_m2} (ref >59)

## 2022-06-15 NOTE — Progress Notes (Signed)
Chief Complaint  Patient presents with   Follow-up    CAD, thoracic aortic aneurysm   History of Present Illness: 62 yo male with history of CAD, thoracic aortic aneurysm, HTN, HLD, GERD, sleep apnea and PVCs here today for cardiac follow up. He was admitted to [facility] in 2014 with a NSTEMI. Cardiac cath with severe stenosis mid LAD and second Diagonal. Drug eluting stents were placed in the LAD and diagonal branch. He has not been treated with a beta blocker due to bradycardia. His Lisinopril was stopped due to hypotension. Exercise stress test in our office May 2014 without ischemia. In 2016, he went to the ED in [place] with chest pain, was admitted and had a stress test which was reported as normal. Echo 01/07/15 with normal LV function, dilated aortic root. Chest CTA January 2022 with stable mildly dilated ascending aorta (4.1 cm). 30 day event monitor in October 2016 showed sinus rhythm with rare PVCs.     He is here today for follow up. The patient denies any chest pain, dyspnea, palpitations, lower extremity edema, orthopnea, PND, dizziness, near syncope or syncope. He is exercising 5 days per week at [facility].   Primary Care Physician: No primary care provider on file.  Past Medical History:  Diagnosis Date   CAD (coronary artery disease), native coronary artery 05/24/2012   a. s/p NSTEMI 2/14: LHC 06/17/12: Mid LAD 90% and 80%, D2 99%, EF 55-60%. PCI: Promus Premier DES to the mid LAD and Promus Premier DES to the D2   Chicken pox    GERD (gastroesophageal reflux disease)    History of echocardiogram    Echo 9/16:  EF 0000000, normal diastolic fxn, mod dilatation of aortic sinuses, mild LAE   Hyperlipidemia    Hypertension    Sinus bradycardia    Thoracic aortic aneurysm (HCC)     Past Surgical History:  Procedure Laterality Date   APPENDECTOMY  1992   CORONARY ANGIOPLASTY WITH STENT PLACEMENT  05/2012   90% then 80% mid LAD stenoses s/p DES x 1, 99% D2 lesion s/p DES x  1; LVEF 55-60%   HERNIA REPAIR  1999   inguinal   LEFT HEART CATHETERIZATION WITH CORONARY ANGIOGRAM N/A 06/17/2012   Procedure: LEFT HEART CATHETERIZATION WITH CORONARY ANGIOGRAM;  Surgeon: Burnell Blanks, MD;  Location: [facility] CATH LAB;  Service: Cardiovascular;  Laterality: N/A;   PERCUTANEOUS CORONARY STENT INTERVENTION (PCI-S)  06/17/2012   Procedure: PERCUTANEOUS CORONARY STENT INTERVENTION (PCI-S);  Surgeon: Burnell Blanks, MD;  Location: [facility] CATH LAB;  Service: Cardiovascular;;    Current Outpatient Medications  Medication Sig Dispense Refill   buPROPion (WELLBUTRIN XL) 150 MG 24 hr tablet Take 1 tablet by mouth daily.     metFORMIN (GLUCOPHAGE) 500 MG tablet Take 500 mg by mouth in the morning and at bedtime.     nitroGLYCERIN (NITROSTAT) 0.4 MG SL tablet PLACE 1 TAB UNDER THE TONGUE EVERY 5 MINUTES X3 DOSES AS NEEDED FOR CHEST PAIN 75 tablet 1   rosuvastatin (CRESTOR) 10 MG tablet Take 1 tablet (10 mg total) by mouth daily. 90 tablet 3   No current facility-administered medications for this visit.    No Known Allergies  Social History   Socioeconomic History   Marital status: Married    Spouse name: Not on file   Number of children: 2   Years of education: Not on file   Highest education level: Not on file  Occupational History   Occupation: Business Manager/sales  Employer: [employer]  Tobacco Use   Smoking status: Never   Smokeless tobacco: Never  Substance and Sexual Activity   Alcohol use: Yes    Alcohol/week: 6.0 - 8.0 standard drinks of alcohol    Types: 6 - 8 Standard drinks or equivalent per week   Drug use: No   Sexual activity: Not on file  Other Topics Concern   Not on file  Social History Narrative   Not on file   Social Determinants of Health   Financial Resource Strain: Not on file  Food Insecurity: Not on file  Transportation Needs: Not on file  Physical Activity: Not on file  Stress: Not on file  Social Connections: Not  on file  Intimate Partner Violence: Not on file    Family History  Problem Relation Age of Onset   CAD Father 37   Heart attack Father    Arthritis Father    Cancer Father        prostate   Heart disease Father    Alcohol abuse Mother    Hypertension Mother     Review of Systems:  As stated in the HPI and otherwise negative.   BP (!) 144/84   Pulse (!) 42   Ht '5\' 9"'$  (1.753 m)   Wt 99 kg   SpO2 99%   BMI 32.22 kg/m   Physical Examination:  General: Well developed, well nourished, NAD  HEENT: OP clear, mucus membranes moist  SKIN: warm, dry. No rashes. Neuro: No focal deficits  Musculoskeletal: Muscle strength 5/5 all ext  Psychiatric: Mood and affect normal  Neck: No JVD, no carotid bruits, no thyromegaly, no lymphadenopathy.  Lungs:Clear bilaterally, no wheezes, rhonci, crackles Cardiovascular: Regular rate and rhythm. No murmurs, gallops or rubs. Abdomen:Soft. Bowel sounds present. Non-tender.  Extremities: No lower extremity edema. Pulses are 2 + in the bilateral DP/PT.  Echo 01/07/15: Left ventricle: The cavity size was normal. Wall thickness was   normal. Systolic function was normal. The estimated ejection   fraction was in the range of 55% to 60%. Left ventricular   diastolic function parameters were normal. - Aorta: Moderate dilatation of the aortic sinuses Suggest cardiac   CTA/ MRI to further assess. - Left atrium: The atrium was mildly dilated. - Atrial septum: No defect or patent foramen ovale was identified.   EKG:  EKG is ordered today. The ekg ordered today demonstrates sinus bradycardia, rate 42 bpm  Recent Labs: No results found for requested labs within last 365 days.   Lipid Panel    Component Value Date/Time   CHOL 163 10/17/2015 0858   CHOL 165 12/07/2013 0859   TRIG 57 10/17/2015 0858   TRIG 70 12/07/2013 0859   HDL 72 10/17/2015 0858   HDL 60 12/07/2013 0859   CHOLHDL 2.3 10/17/2015 0858   VLDL 11 10/17/2015 0858   LDLCALC 80  10/17/2015 0858   LDLCALC 91 12/07/2013 0859     Wt Readings from Last 3 Encounters:  06/15/22 99 kg  05/29/21 97.9 kg  05/26/19 103.9 kg    Assessment and Plan:   1. CAD without angina: No chest pain suggestive of angina. He does not tolerate beta blockers due to bradycardia. Continue ASA and statin  2. HTN: BP is well controlled at home. No changes today  3. Hyperlipidemia: He does not tolerate high doses of statins. His lipids are followed in primary care. Continue statin  4. Thoracic aortic aneurysm: Mild dilation of the ascending aorta, 4.1 cm  by CTA in February 2023. Repeat CTA now. BMET today.   Labs/ tests ordered today include:   Orders Placed This Encounter  Procedures   CT ANGIO CHEST AORTA W/CM & OR WO/CM   Basic Metabolic Panel (BMET)   EKG 12-Lead   Disposition:   F/U with me in 12 months  Signed, Lauree Chandler, MD 06/15/2022 8:40 AM    [facility] [place], [place], [place]  [address] Phone: [phone]; Fax: [phone]

## 2022-06-15 NOTE — Patient Instructions (Signed)
Medication Instructions:  No changes *If you need a refill on your cardiac medications before your next appointment, please call your pharmacy*   Lab Work: Today: bmet    Testing/Procedures: Chest CTA Aorta -    Follow-Up: At [facility], you and your health needs are our priority.  As part of our continuing mission to provide you with exceptional heart care, we have created designated Provider Care Teams.  These Care Teams include your primary Cardiologist (physician) and Advanced Practice Providers (APPs -  Physician Assistants and Nurse Practitioners) who all work together to provide you with the care you need, when you need it.   Your next appointment:   12 month(s)  Provider:   Lauree Chandler, MD

## 2022-07-11 ENCOUNTER — Ambulatory Visit
Admission: RE | Admit: 2022-07-11 | Discharge: 2022-07-11 | Disposition: A | Discharge status: Home / Self Care | Payer: BC Managed Care – PPO | Source: Ambulatory Visit | Attending: Cardiovascular Disease | Admitting: Cardiovascular Disease

## 2022-07-11 DIAGNOSIS — I712 Thoracic aortic aneurysm, without rupture, unspecified: Secondary | ICD-10-CM

## 2022-07-11 DIAGNOSIS — I2510 Atherosclerotic heart disease of native coronary artery without angina pectoris: Secondary | ICD-10-CM | POA: Diagnosis not present

## 2022-07-11 DIAGNOSIS — M50323 Other cervical disc degeneration at C6-C7 level: Secondary | ICD-10-CM | POA: Diagnosis not present

## 2022-07-11 DIAGNOSIS — M50322 Other cervical disc degeneration at C5-C6 level: Secondary | ICD-10-CM | POA: Diagnosis not present

## 2022-07-11 MED ORDER — IOPAMIDOL (ISOVUE-370) INJECTION 76%
75.0000 mL | Freq: Once | INTRAVENOUS | Status: AC | PRN
  Administered 2022-07-11: 75 mL via INTRAVENOUS

## 2022-09-13 DIAGNOSIS — M9906 Segmental and somatic dysfunction of lower extremity: Secondary | ICD-10-CM | POA: Diagnosis not present

## 2022-09-13 DIAGNOSIS — M9903 Segmental and somatic dysfunction of lumbar region: Secondary | ICD-10-CM | POA: Diagnosis not present

## 2022-09-13 DIAGNOSIS — M9905 Segmental and somatic dysfunction of pelvic region: Secondary | ICD-10-CM | POA: Diagnosis not present

## 2022-09-13 DIAGNOSIS — M9902 Segmental and somatic dysfunction of thoracic region: Secondary | ICD-10-CM | POA: Diagnosis not present

## 2022-09-18 DIAGNOSIS — M9905 Segmental and somatic dysfunction of pelvic region: Secondary | ICD-10-CM | POA: Diagnosis not present

## 2022-09-18 DIAGNOSIS — M9903 Segmental and somatic dysfunction of lumbar region: Secondary | ICD-10-CM | POA: Diagnosis not present

## 2022-09-18 DIAGNOSIS — M9902 Segmental and somatic dysfunction of thoracic region: Secondary | ICD-10-CM | POA: Diagnosis not present

## 2022-09-18 DIAGNOSIS — M9906 Segmental and somatic dysfunction of lower extremity: Secondary | ICD-10-CM | POA: Diagnosis not present

## 2022-09-25 DIAGNOSIS — M9905 Segmental and somatic dysfunction of pelvic region: Secondary | ICD-10-CM | POA: Diagnosis not present

## 2022-09-25 DIAGNOSIS — M9903 Segmental and somatic dysfunction of lumbar region: Secondary | ICD-10-CM | POA: Diagnosis not present

## 2022-09-25 DIAGNOSIS — M9902 Segmental and somatic dysfunction of thoracic region: Secondary | ICD-10-CM | POA: Diagnosis not present

## 2022-09-25 DIAGNOSIS — M9906 Segmental and somatic dysfunction of lower extremity: Secondary | ICD-10-CM | POA: Diagnosis not present

## 2022-10-01 DIAGNOSIS — M9905 Segmental and somatic dysfunction of pelvic region: Secondary | ICD-10-CM | POA: Diagnosis not present

## 2022-10-01 DIAGNOSIS — M9903 Segmental and somatic dysfunction of lumbar region: Secondary | ICD-10-CM | POA: Diagnosis not present

## 2022-10-01 DIAGNOSIS — M9906 Segmental and somatic dysfunction of lower extremity: Secondary | ICD-10-CM | POA: Diagnosis not present

## 2022-10-01 DIAGNOSIS — M9902 Segmental and somatic dysfunction of thoracic region: Secondary | ICD-10-CM | POA: Diagnosis not present

## 2022-10-10 DIAGNOSIS — M9906 Segmental and somatic dysfunction of lower extremity: Secondary | ICD-10-CM | POA: Diagnosis not present

## 2022-10-10 DIAGNOSIS — M9902 Segmental and somatic dysfunction of thoracic region: Secondary | ICD-10-CM | POA: Diagnosis not present

## 2022-10-10 DIAGNOSIS — M9905 Segmental and somatic dysfunction of pelvic region: Secondary | ICD-10-CM | POA: Diagnosis not present

## 2022-10-10 DIAGNOSIS — M9903 Segmental and somatic dysfunction of lumbar region: Secondary | ICD-10-CM | POA: Diagnosis not present

## 2022-10-17 DIAGNOSIS — M9903 Segmental and somatic dysfunction of lumbar region: Secondary | ICD-10-CM | POA: Diagnosis not present

## 2022-10-17 DIAGNOSIS — M9905 Segmental and somatic dysfunction of pelvic region: Secondary | ICD-10-CM | POA: Diagnosis not present

## 2022-10-17 DIAGNOSIS — M9906 Segmental and somatic dysfunction of lower extremity: Secondary | ICD-10-CM | POA: Diagnosis not present

## 2022-10-17 DIAGNOSIS — M9902 Segmental and somatic dysfunction of thoracic region: Secondary | ICD-10-CM | POA: Diagnosis not present

## 2022-10-24 DIAGNOSIS — M9905 Segmental and somatic dysfunction of pelvic region: Secondary | ICD-10-CM | POA: Diagnosis not present

## 2022-10-24 DIAGNOSIS — M9906 Segmental and somatic dysfunction of lower extremity: Secondary | ICD-10-CM | POA: Diagnosis not present

## 2022-10-24 DIAGNOSIS — M9903 Segmental and somatic dysfunction of lumbar region: Secondary | ICD-10-CM | POA: Diagnosis not present

## 2022-10-24 DIAGNOSIS — M9902 Segmental and somatic dysfunction of thoracic region: Secondary | ICD-10-CM | POA: Diagnosis not present

## 2022-11-14 DIAGNOSIS — L821 Other seborrheic keratosis: Secondary | ICD-10-CM | POA: Diagnosis not present

## 2022-11-14 DIAGNOSIS — C44329 Squamous cell carcinoma of skin of other parts of face: Secondary | ICD-10-CM | POA: Diagnosis not present

## 2022-11-14 DIAGNOSIS — L814 Other melanin hyperpigmentation: Secondary | ICD-10-CM | POA: Diagnosis not present

## 2022-11-14 DIAGNOSIS — Z7189 Other specified counseling: Secondary | ICD-10-CM | POA: Diagnosis not present

## 2022-11-14 DIAGNOSIS — D235 Other benign neoplasm of skin of trunk: Secondary | ICD-10-CM | POA: Diagnosis not present

## 2022-11-14 DIAGNOSIS — D492 Neoplasm of unspecified behavior of bone, soft tissue, and skin: Secondary | ICD-10-CM | POA: Diagnosis not present

## 2022-11-21 DIAGNOSIS — M9902 Segmental and somatic dysfunction of thoracic region: Secondary | ICD-10-CM | POA: Diagnosis not present

## 2022-11-21 DIAGNOSIS — M9903 Segmental and somatic dysfunction of lumbar region: Secondary | ICD-10-CM | POA: Diagnosis not present

## 2022-11-21 DIAGNOSIS — M9906 Segmental and somatic dysfunction of lower extremity: Secondary | ICD-10-CM | POA: Diagnosis not present

## 2022-11-21 DIAGNOSIS — M9905 Segmental and somatic dysfunction of pelvic region: Secondary | ICD-10-CM | POA: Diagnosis not present

## 2022-12-31 DIAGNOSIS — D0439 Carcinoma in situ of skin of other parts of face: Secondary | ICD-10-CM | POA: Diagnosis not present

## 2023-01-15 DIAGNOSIS — M9903 Segmental and somatic dysfunction of lumbar region: Secondary | ICD-10-CM | POA: Diagnosis not present

## 2023-01-15 DIAGNOSIS — M9906 Segmental and somatic dysfunction of lower extremity: Secondary | ICD-10-CM | POA: Diagnosis not present

## 2023-01-15 DIAGNOSIS — M9905 Segmental and somatic dysfunction of pelvic region: Secondary | ICD-10-CM | POA: Diagnosis not present

## 2023-01-15 DIAGNOSIS — M9902 Segmental and somatic dysfunction of thoracic region: Secondary | ICD-10-CM | POA: Diagnosis not present

## 2023-01-24 ENCOUNTER — Other Ambulatory Visit: Payer: Self-pay | Admitting: Cardiovascular Disease

## 2023-01-24 DIAGNOSIS — M9903 Segmental and somatic dysfunction of lumbar region: Secondary | ICD-10-CM | POA: Diagnosis not present

## 2023-01-24 DIAGNOSIS — M9905 Segmental and somatic dysfunction of pelvic region: Secondary | ICD-10-CM | POA: Diagnosis not present

## 2023-01-24 DIAGNOSIS — M9902 Segmental and somatic dysfunction of thoracic region: Secondary | ICD-10-CM | POA: Diagnosis not present

## 2023-01-24 DIAGNOSIS — M9906 Segmental and somatic dysfunction of lower extremity: Secondary | ICD-10-CM | POA: Diagnosis not present

## 2023-01-30 DIAGNOSIS — M9903 Segmental and somatic dysfunction of lumbar region: Secondary | ICD-10-CM | POA: Diagnosis not present

## 2023-01-30 DIAGNOSIS — M9905 Segmental and somatic dysfunction of pelvic region: Secondary | ICD-10-CM | POA: Diagnosis not present

## 2023-01-30 DIAGNOSIS — M9902 Segmental and somatic dysfunction of thoracic region: Secondary | ICD-10-CM | POA: Diagnosis not present

## 2023-01-30 DIAGNOSIS — M9906 Segmental and somatic dysfunction of lower extremity: Secondary | ICD-10-CM | POA: Diagnosis not present

## 2023-02-05 DIAGNOSIS — D224 Melanocytic nevi of scalp and neck: Secondary | ICD-10-CM | POA: Diagnosis not present

## 2023-02-05 DIAGNOSIS — D485 Neoplasm of uncertain behavior of skin: Secondary | ICD-10-CM | POA: Diagnosis not present

## 2023-02-05 DIAGNOSIS — L821 Other seborrheic keratosis: Secondary | ICD-10-CM | POA: Diagnosis not present

## 2023-02-05 DIAGNOSIS — Z4802 Encounter for removal of sutures: Secondary | ICD-10-CM | POA: Diagnosis not present

## 2023-02-07 DIAGNOSIS — M9903 Segmental and somatic dysfunction of lumbar region: Secondary | ICD-10-CM | POA: Diagnosis not present

## 2023-02-07 DIAGNOSIS — M9902 Segmental and somatic dysfunction of thoracic region: Secondary | ICD-10-CM | POA: Diagnosis not present

## 2023-02-07 DIAGNOSIS — M9905 Segmental and somatic dysfunction of pelvic region: Secondary | ICD-10-CM | POA: Diagnosis not present

## 2023-02-07 DIAGNOSIS — M9906 Segmental and somatic dysfunction of lower extremity: Secondary | ICD-10-CM | POA: Diagnosis not present

## 2023-02-13 LAB — LAB REPORT - SCANNED
A1c: 5.6
EGFR (Non-African Amer.): 66
Free T4: 1.06 ng/dL
PSA, Total: 0.5
TSH: 1.89 (ref 0.41–5.90)

## 2023-02-19 DIAGNOSIS — M9905 Segmental and somatic dysfunction of pelvic region: Secondary | ICD-10-CM | POA: Diagnosis not present

## 2023-02-19 DIAGNOSIS — M9906 Segmental and somatic dysfunction of lower extremity: Secondary | ICD-10-CM | POA: Diagnosis not present

## 2023-02-19 DIAGNOSIS — M9903 Segmental and somatic dysfunction of lumbar region: Secondary | ICD-10-CM | POA: Diagnosis not present

## 2023-02-19 DIAGNOSIS — M9902 Segmental and somatic dysfunction of thoracic region: Secondary | ICD-10-CM | POA: Diagnosis not present

## 2023-02-25 DIAGNOSIS — M9903 Segmental and somatic dysfunction of lumbar region: Secondary | ICD-10-CM | POA: Diagnosis not present

## 2023-02-25 DIAGNOSIS — M9905 Segmental and somatic dysfunction of pelvic region: Secondary | ICD-10-CM | POA: Diagnosis not present

## 2023-02-25 DIAGNOSIS — M9902 Segmental and somatic dysfunction of thoracic region: Secondary | ICD-10-CM | POA: Diagnosis not present

## 2023-02-25 DIAGNOSIS — M9906 Segmental and somatic dysfunction of lower extremity: Secondary | ICD-10-CM | POA: Diagnosis not present

## 2023-02-28 DIAGNOSIS — M9902 Segmental and somatic dysfunction of thoracic region: Secondary | ICD-10-CM | POA: Diagnosis not present

## 2023-02-28 DIAGNOSIS — M9905 Segmental and somatic dysfunction of pelvic region: Secondary | ICD-10-CM | POA: Diagnosis not present

## 2023-02-28 DIAGNOSIS — M9906 Segmental and somatic dysfunction of lower extremity: Secondary | ICD-10-CM | POA: Diagnosis not present

## 2023-02-28 DIAGNOSIS — M9903 Segmental and somatic dysfunction of lumbar region: Secondary | ICD-10-CM | POA: Diagnosis not present

## 2023-03-04 DIAGNOSIS — M9903 Segmental and somatic dysfunction of lumbar region: Secondary | ICD-10-CM | POA: Diagnosis not present

## 2023-03-04 DIAGNOSIS — M9902 Segmental and somatic dysfunction of thoracic region: Secondary | ICD-10-CM | POA: Diagnosis not present

## 2023-03-04 DIAGNOSIS — M9905 Segmental and somatic dysfunction of pelvic region: Secondary | ICD-10-CM | POA: Diagnosis not present

## 2023-03-04 DIAGNOSIS — M9906 Segmental and somatic dysfunction of lower extremity: Secondary | ICD-10-CM | POA: Diagnosis not present

## 2023-03-12 DIAGNOSIS — M9905 Segmental and somatic dysfunction of pelvic region: Secondary | ICD-10-CM | POA: Diagnosis not present

## 2023-03-12 DIAGNOSIS — M9906 Segmental and somatic dysfunction of lower extremity: Secondary | ICD-10-CM | POA: Diagnosis not present

## 2023-03-12 DIAGNOSIS — M9903 Segmental and somatic dysfunction of lumbar region: Secondary | ICD-10-CM | POA: Diagnosis not present

## 2023-03-12 DIAGNOSIS — M9902 Segmental and somatic dysfunction of thoracic region: Secondary | ICD-10-CM | POA: Diagnosis not present

## 2023-03-25 DIAGNOSIS — M9903 Segmental and somatic dysfunction of lumbar region: Secondary | ICD-10-CM | POA: Diagnosis not present

## 2023-03-25 DIAGNOSIS — M9905 Segmental and somatic dysfunction of pelvic region: Secondary | ICD-10-CM | POA: Diagnosis not present

## 2023-03-25 DIAGNOSIS — M9906 Segmental and somatic dysfunction of lower extremity: Secondary | ICD-10-CM | POA: Diagnosis not present

## 2023-03-25 DIAGNOSIS — M9902 Segmental and somatic dysfunction of thoracic region: Secondary | ICD-10-CM | POA: Diagnosis not present

## 2023-04-01 DIAGNOSIS — M9905 Segmental and somatic dysfunction of pelvic region: Secondary | ICD-10-CM | POA: Diagnosis not present

## 2023-04-01 DIAGNOSIS — M9906 Segmental and somatic dysfunction of lower extremity: Secondary | ICD-10-CM | POA: Diagnosis not present

## 2023-04-01 DIAGNOSIS — M9902 Segmental and somatic dysfunction of thoracic region: Secondary | ICD-10-CM | POA: Diagnosis not present

## 2023-04-01 DIAGNOSIS — M9903 Segmental and somatic dysfunction of lumbar region: Secondary | ICD-10-CM | POA: Diagnosis not present

## 2023-04-23 DIAGNOSIS — M9902 Segmental and somatic dysfunction of thoracic region: Secondary | ICD-10-CM | POA: Diagnosis not present

## 2023-04-23 DIAGNOSIS — M9906 Segmental and somatic dysfunction of lower extremity: Secondary | ICD-10-CM | POA: Diagnosis not present

## 2023-04-23 DIAGNOSIS — M9905 Segmental and somatic dysfunction of pelvic region: Secondary | ICD-10-CM | POA: Diagnosis not present

## 2023-04-23 DIAGNOSIS — M9903 Segmental and somatic dysfunction of lumbar region: Secondary | ICD-10-CM | POA: Diagnosis not present

## 2023-04-26 DIAGNOSIS — M9902 Segmental and somatic dysfunction of thoracic region: Secondary | ICD-10-CM | POA: Diagnosis not present

## 2023-04-26 DIAGNOSIS — M9903 Segmental and somatic dysfunction of lumbar region: Secondary | ICD-10-CM | POA: Diagnosis not present

## 2023-04-26 DIAGNOSIS — M9906 Segmental and somatic dysfunction of lower extremity: Secondary | ICD-10-CM | POA: Diagnosis not present

## 2023-04-26 DIAGNOSIS — M9905 Segmental and somatic dysfunction of pelvic region: Secondary | ICD-10-CM | POA: Diagnosis not present

## 2023-04-29 DIAGNOSIS — M9905 Segmental and somatic dysfunction of pelvic region: Secondary | ICD-10-CM | POA: Diagnosis not present

## 2023-04-29 DIAGNOSIS — M9906 Segmental and somatic dysfunction of lower extremity: Secondary | ICD-10-CM | POA: Diagnosis not present

## 2023-04-29 DIAGNOSIS — M9903 Segmental and somatic dysfunction of lumbar region: Secondary | ICD-10-CM | POA: Diagnosis not present

## 2023-04-29 DIAGNOSIS — M9902 Segmental and somatic dysfunction of thoracic region: Secondary | ICD-10-CM | POA: Diagnosis not present

## 2023-05-02 DIAGNOSIS — M9906 Segmental and somatic dysfunction of lower extremity: Secondary | ICD-10-CM | POA: Diagnosis not present

## 2023-05-02 DIAGNOSIS — M9902 Segmental and somatic dysfunction of thoracic region: Secondary | ICD-10-CM | POA: Diagnosis not present

## 2023-05-02 DIAGNOSIS — M9903 Segmental and somatic dysfunction of lumbar region: Secondary | ICD-10-CM | POA: Diagnosis not present

## 2023-05-02 DIAGNOSIS — M9905 Segmental and somatic dysfunction of pelvic region: Secondary | ICD-10-CM | POA: Diagnosis not present

## 2023-05-06 DIAGNOSIS — M9906 Segmental and somatic dysfunction of lower extremity: Secondary | ICD-10-CM | POA: Diagnosis not present

## 2023-05-06 DIAGNOSIS — M9902 Segmental and somatic dysfunction of thoracic region: Secondary | ICD-10-CM | POA: Diagnosis not present

## 2023-05-06 DIAGNOSIS — M9905 Segmental and somatic dysfunction of pelvic region: Secondary | ICD-10-CM | POA: Diagnosis not present

## 2023-05-06 DIAGNOSIS — M9903 Segmental and somatic dysfunction of lumbar region: Secondary | ICD-10-CM | POA: Diagnosis not present

## 2023-05-10 DIAGNOSIS — M9906 Segmental and somatic dysfunction of lower extremity: Secondary | ICD-10-CM | POA: Diagnosis not present

## 2023-05-10 DIAGNOSIS — M9903 Segmental and somatic dysfunction of lumbar region: Secondary | ICD-10-CM | POA: Diagnosis not present

## 2023-05-10 DIAGNOSIS — M9902 Segmental and somatic dysfunction of thoracic region: Secondary | ICD-10-CM | POA: Diagnosis not present

## 2023-05-10 DIAGNOSIS — M9905 Segmental and somatic dysfunction of pelvic region: Secondary | ICD-10-CM | POA: Diagnosis not present

## 2023-05-14 DIAGNOSIS — M9903 Segmental and somatic dysfunction of lumbar region: Secondary | ICD-10-CM | POA: Diagnosis not present

## 2023-05-14 DIAGNOSIS — M9905 Segmental and somatic dysfunction of pelvic region: Secondary | ICD-10-CM | POA: Diagnosis not present

## 2023-05-14 DIAGNOSIS — M9902 Segmental and somatic dysfunction of thoracic region: Secondary | ICD-10-CM | POA: Diagnosis not present

## 2023-05-14 DIAGNOSIS — M9906 Segmental and somatic dysfunction of lower extremity: Secondary | ICD-10-CM | POA: Diagnosis not present

## 2023-05-16 DIAGNOSIS — M9905 Segmental and somatic dysfunction of pelvic region: Secondary | ICD-10-CM | POA: Diagnosis not present

## 2023-05-16 DIAGNOSIS — M9902 Segmental and somatic dysfunction of thoracic region: Secondary | ICD-10-CM | POA: Diagnosis not present

## 2023-05-16 DIAGNOSIS — M9906 Segmental and somatic dysfunction of lower extremity: Secondary | ICD-10-CM | POA: Diagnosis not present

## 2023-05-16 DIAGNOSIS — M9903 Segmental and somatic dysfunction of lumbar region: Secondary | ICD-10-CM | POA: Diagnosis not present

## 2023-05-20 DIAGNOSIS — M9903 Segmental and somatic dysfunction of lumbar region: Secondary | ICD-10-CM | POA: Diagnosis not present

## 2023-05-20 DIAGNOSIS — M9906 Segmental and somatic dysfunction of lower extremity: Secondary | ICD-10-CM | POA: Diagnosis not present

## 2023-05-20 DIAGNOSIS — M9905 Segmental and somatic dysfunction of pelvic region: Secondary | ICD-10-CM | POA: Diagnosis not present

## 2023-05-20 DIAGNOSIS — M9902 Segmental and somatic dysfunction of thoracic region: Secondary | ICD-10-CM | POA: Diagnosis not present

## 2023-05-23 DIAGNOSIS — M9902 Segmental and somatic dysfunction of thoracic region: Secondary | ICD-10-CM | POA: Diagnosis not present

## 2023-05-23 DIAGNOSIS — M9903 Segmental and somatic dysfunction of lumbar region: Secondary | ICD-10-CM | POA: Diagnosis not present

## 2023-05-23 DIAGNOSIS — M9906 Segmental and somatic dysfunction of lower extremity: Secondary | ICD-10-CM | POA: Diagnosis not present

## 2023-05-23 DIAGNOSIS — M9905 Segmental and somatic dysfunction of pelvic region: Secondary | ICD-10-CM | POA: Diagnosis not present

## 2023-05-29 DIAGNOSIS — D235 Other benign neoplasm of skin of trunk: Secondary | ICD-10-CM | POA: Diagnosis not present

## 2023-05-29 DIAGNOSIS — L570 Actinic keratosis: Secondary | ICD-10-CM | POA: Diagnosis not present

## 2023-05-29 DIAGNOSIS — M9906 Segmental and somatic dysfunction of lower extremity: Secondary | ICD-10-CM | POA: Diagnosis not present

## 2023-05-29 DIAGNOSIS — M9902 Segmental and somatic dysfunction of thoracic region: Secondary | ICD-10-CM | POA: Diagnosis not present

## 2023-05-29 DIAGNOSIS — M9903 Segmental and somatic dysfunction of lumbar region: Secondary | ICD-10-CM | POA: Diagnosis not present

## 2023-05-29 DIAGNOSIS — L821 Other seborrheic keratosis: Secondary | ICD-10-CM | POA: Diagnosis not present

## 2023-05-29 DIAGNOSIS — D294 Benign neoplasm of scrotum: Secondary | ICD-10-CM | POA: Diagnosis not present

## 2023-05-29 DIAGNOSIS — X32XXXA Exposure to sunlight, initial encounter: Secondary | ICD-10-CM | POA: Diagnosis not present

## 2023-05-29 DIAGNOSIS — M9905 Segmental and somatic dysfunction of pelvic region: Secondary | ICD-10-CM | POA: Diagnosis not present

## 2023-05-29 DIAGNOSIS — L814 Other melanin hyperpigmentation: Secondary | ICD-10-CM | POA: Diagnosis not present

## 2023-06-03 DIAGNOSIS — M9906 Segmental and somatic dysfunction of lower extremity: Secondary | ICD-10-CM | POA: Diagnosis not present

## 2023-06-03 DIAGNOSIS — M9902 Segmental and somatic dysfunction of thoracic region: Secondary | ICD-10-CM | POA: Diagnosis not present

## 2023-06-03 DIAGNOSIS — M9905 Segmental and somatic dysfunction of pelvic region: Secondary | ICD-10-CM | POA: Diagnosis not present

## 2023-06-03 DIAGNOSIS — M9903 Segmental and somatic dysfunction of lumbar region: Secondary | ICD-10-CM | POA: Diagnosis not present

## 2023-06-06 DIAGNOSIS — M9905 Segmental and somatic dysfunction of pelvic region: Secondary | ICD-10-CM | POA: Diagnosis not present

## 2023-06-06 DIAGNOSIS — M9903 Segmental and somatic dysfunction of lumbar region: Secondary | ICD-10-CM | POA: Diagnosis not present

## 2023-06-06 DIAGNOSIS — M9906 Segmental and somatic dysfunction of lower extremity: Secondary | ICD-10-CM | POA: Diagnosis not present

## 2023-06-06 DIAGNOSIS — M9902 Segmental and somatic dysfunction of thoracic region: Secondary | ICD-10-CM | POA: Diagnosis not present

## 2023-06-11 DIAGNOSIS — M9902 Segmental and somatic dysfunction of thoracic region: Secondary | ICD-10-CM | POA: Diagnosis not present

## 2023-06-11 DIAGNOSIS — M9903 Segmental and somatic dysfunction of lumbar region: Secondary | ICD-10-CM | POA: Diagnosis not present

## 2023-06-11 DIAGNOSIS — M9905 Segmental and somatic dysfunction of pelvic region: Secondary | ICD-10-CM | POA: Diagnosis not present

## 2023-06-11 DIAGNOSIS — M9906 Segmental and somatic dysfunction of lower extremity: Secondary | ICD-10-CM | POA: Diagnosis not present

## 2023-06-12 ENCOUNTER — Ambulatory Visit: Payer: BC Managed Care – PPO | Attending: Cardiology | Admitting: Cardiology

## 2023-06-12 ENCOUNTER — Encounter: Payer: Self-pay | Admitting: Cardiology

## 2023-06-12 VITALS — BP 146/80 | HR 48 | Ht 69.0 in | Wt 224.0 lb

## 2023-06-12 DIAGNOSIS — E7800 Pure hypercholesterolemia, unspecified: Secondary | ICD-10-CM | POA: Diagnosis not present

## 2023-06-12 DIAGNOSIS — I2510 Atherosclerotic heart disease of native coronary artery without angina pectoris: Secondary | ICD-10-CM

## 2023-06-12 DIAGNOSIS — I10 Essential (primary) hypertension: Secondary | ICD-10-CM

## 2023-06-12 DIAGNOSIS — I712 Thoracic aortic aneurysm, without rupture, unspecified: Secondary | ICD-10-CM

## 2023-06-12 DIAGNOSIS — R001 Bradycardia, unspecified: Secondary | ICD-10-CM

## 2023-06-12 NOTE — Progress Notes (Signed)
Cardiology Office Note:    Date:  06/12/2023   ID:  TLALOC TADDEI, DOB Dec 24, 1960, MRN [id number]  PCP:  Joshua King, No Pcp Per   [facility] Providers Cardiologist:  Verne Carrow, MD     Referring MD: No ref. provider found   Chief Complaint  Joshua King presents with   Follow-up   History of Present Illness:    Joshua King is a 63 y.o. male with a hx of CAD s/p PCI of the LAD and diagonal branch, HTN, HLD, thoracic aneurysm, GERD, sleep apnea, and PVCs who is here today for annual follow up, seen for Dr. Clifton Hurshel.  Mr. Niess underwent PCI of the LAD and diagonal branch in 2014 with subsequent ETT 2014 with no ischemia. Had a stress test while in [facility] in 2016 after presenting to the ED with chest pain that was also reported as negative. Review of CTA chest over the years with very stable ascending aneurysm.   He is here today alone and states he continues to do very well. He is still exercising 5 days per week. His office provides a wellness NP who he follows with very closely for labs and other indicators. Denies chest pain, SOB, palpitations, LE edema, orthopnea, PND, dizziness, or syncope. Denies bleeding in stool or urine.     Past Medical History:  Diagnosis Date   CAD (coronary artery disease), native coronary artery 05/24/2012   a. s/p NSTEMI 2/14: LHC 06/17/12: Mid LAD 90% and 80%, D2 99%, EF 55-60%. PCI: Promus Premier DES to the mid LAD and Promus Premier DES to the D2   Chicken pox    GERD (gastroesophageal reflux disease)    History of echocardiogram    Echo 9/16:  EF 55-60%, normal diastolic fxn, mod dilatation of aortic sinuses, mild LAE   Hyperlipidemia    Hypertension    Sinus bradycardia    Thoracic aortic aneurysm (HCC)     Past Surgical History:  Procedure Laterality Date   APPENDECTOMY  1992   CORONARY ANGIOPLASTY WITH STENT PLACEMENT  05/2012   90% then 80% mid LAD stenoses s/p DES x 1, 99% D2 lesion s/p DES x 1; LVEF 55-60%   HERNIA REPAIR   1999   inguinal   LEFT HEART CATHETERIZATION WITH CORONARY ANGIOGRAM N/A 06/17/2012   Procedure: LEFT HEART CATHETERIZATION WITH CORONARY ANGIOGRAM;  Surgeon: Kathleene Hazel, MD;  Location: [facility] CATH LAB;  Service: Cardiovascular;  Laterality: N/A;   PERCUTANEOUS CORONARY STENT INTERVENTION (PCI-S)  06/17/2012   Procedure: PERCUTANEOUS CORONARY STENT INTERVENTION (PCI-S);  Surgeon: Kathleene Hazel, MD;  Location: [facility] CATH LAB;  Service: Cardiovascular;;    Current Medications: Current Meds  Medication Sig   aspirin EC 81 MG tablet Take 81 mg by mouth daily. Swallow whole.   buPROPion (WELLBUTRIN XL) 150 MG 24 hr tablet Take 1 tablet by mouth daily.   metFORMIN (GLUCOPHAGE) 500 MG tablet Take 500 mg by mouth in the morning and at bedtime.   rosuvastatin (CRESTOR) 10 MG tablet TAKE 1 TABLET BY MOUTH EVERY DAY     Allergies:   Joshua King has no known allergies.   Social History   Socioeconomic History   Marital status: Married    Spouse name: Not on file   Number of children: 2   Years of education: Not on file   Highest education level: Not on file  Occupational History   Occupation: Business Manager/sales    Employer: [employer]  Tobacco Use   Smoking  status: Never   Smokeless tobacco: Never  Substance and Sexual Activity   Alcohol use: Yes    Alcohol/week: 6.0 - 8.0 standard drinks of alcohol    Types: 6 - 8 Standard drinks or equivalent per week   Drug use: No   Sexual activity: Not on file  Other Topics Concern   Not on file  Social History Narrative   Not on file   Social Drivers of Health   Financial Resource Strain: Not on file  Food Insecurity: Not on file  Transportation Needs: Not on file  Physical Activity: Not on file  Stress: Not on file  Social Connections: Not on file    Family History: The Joshua King's family history includes Alcohol abuse in his mother; Arthritis in his father; CAD (age of onset: 19) in his father; Cancer in his father;  Heart attack in his father; Heart disease in his father; Hypertension in his mother.  ROS:   Please see the history of present illness.     All other systems reviewed and are negative.  EKGs/Labs/Other Studies Reviewed:    The following studies were reviewed today:  EKG Interpretation Date/Time:  Wednesday June 12 2023 10:32:22 EST Ventricular Rate:  48 PR Interval:  186 QRS Duration:  110 QT Interval:  446 QTC Calculation: 398 R Axis:   0  Text Interpretation: Sinus bradycardia Cannot rule out Anterior infarct , age undetermined When compared with ECG of 18-Jun-2012 05:47, Minimal criteria for Anterior infarct are now Present T wave amplitude has decreased in Inferior leads Confirmed by Georgie Chard ([id number]) on 06/12/2023 12:41:44 PM    Recent Labs: 06/15/2022: BUN 17; Creatinine, Ser 1.18; Potassium 4.8; Sodium 138  Recent Lipid Panel    Component Value Date/Time   CHOL 163 10/17/2015 0858   CHOL 165 12/07/2013 0859   TRIG 57 10/17/2015 0858   TRIG 70 12/07/2013 0859   HDL 72 10/17/2015 0858   HDL 60 12/07/2013 0859   CHOLHDL 2.3 10/17/2015 0858   VLDL 11 10/17/2015 0858   LDLCALC 80 10/17/2015 0858   LDLCALC 91 12/07/2013 0859   Physical Exam:    VS:  BP (!) 146/80   Pulse (!) 48   Ht 5\' 9"  (1.753 m)   Wt 224 lb (101.6 kg)   SpO2 100%   BMI 33.08 kg/m     Wt Readings from Last 3 Encounters:  06/12/23 224 lb (101.6 kg)  06/15/22 218 lb 3.2 oz (99 kg)  05/29/21 215 lb 12.8 oz (97.9 kg)   General: Well developed, well nourished, NAD Lungs:Clear to ausculation bilaterally. No wheezes, rales, or rhonchi. Breathing is unlabored. Cardiovascular: RRR with S1 S2. No murmurs Extremities: No edema.  Neuro: Alert and oriented. No focal deficits. No facial asymmetry. MAE spontaneously. Psych: Responds to questions appropriately with normal affect.    ASSESSMENT/PLAN:    CAD: s/p PCI to the LAD/diagonal branch with subsequent negative stress tests since. No  anginal symptoms today. Continue daily exercise and risk factor modifications. Interested in CTA coronary given length of time since last evaluation. With prior stents, PET may be better indicator. Continue ASA, statin.   HTN: Above baseline today however reports BPs at wellness office with SBPs in the 120's. Asked him to monitor this closely and if consistently above 130-135, will need to make adjustments.   HLD: Will upload recent lab work to [facility]. Also interested in LPa therefore will obtain today.   Thoracic aneurysm: Very stable on annual CTA. Plan imaging as  above.      Medication Adjustments/Labs and Tests Ordered: Current medicines are reviewed at length with the Joshua King today.  Concerns regarding medicines are outlined above.  Orders Placed This Encounter  Procedures   CT CORONARY MORPH W/CTA COR W/SCORE W/CA W/CM &/OR WO/CM   Lipoprotein A (LPA)   Apolipoprotein B   EKG 12-Lead   No orders of the defined types were placed in this encounter.   Joshua King Instructions  Medication Instructions:  Your physician recommends that you continue on your current medications as directed. Please refer to the Current Medication list given to you today.  *If you need a refill on your cardiac medications before your next appointment, please call your pharmacy*  Lab Work: To be completed within a week - Lipoprotein-A & Apolipoprotein B   If you have labs (blood work) drawn today and your tests are completely normal, you will receive your results only by: MyChart Message (if you have MyChart) OR A paper copy in the mail If you have any lab test that is abnormal or we need to change your treatment, we will call you to review the results.  Testing/Procedures: None ordered today.  Follow-Up: At [facility], you and your health needs are our priority.  As part of our continuing mission to provide you with exceptional heart care, we have created designated Provider Care Teams.  These  Care Teams include your primary Cardiologist (physician) and Advanced Practice Providers (APPs -  Physician Assistants and Nurse Practitioners) who all work together to provide you with the care you need, when you need it.   Your next appointment:   1 year(s)  The format for your next appointment:   In Person  Provider:   Verne Carrow, MD {  Other Instructions   Your cardiac CT will be scheduled at one of the below locations:   [facility] [address] [place], [place] [address] [phone]   If scheduled at [facility] please arrive at the [facility]tr[facility] (Entrance C2) of [facility] 30 minutes prior to test start time. You can use the FREE valet parking offered at entrance C (encouraged to control the heart rate for the test)  Proceed to the [facility] Radiology Department (first floor) to check-in and test prep.  All radiology patients and guests should use entrance C2 at [facility], accessed from [facility], even though the hospital's physical address listed is [address]    Please follow these instructions carefully (unless otherwise directed):  An IV will be required for this test and Nitroglycerin will be given.  Hold all erectile dysfunction medications at least 3 days (72 hrs) prior to test. (Ie viagra, cialis, sildenafil, tadalafil, etc)   On the Night Before the Test: Be sure to Drink plenty of water. Do not consume any caffeinated/decaffeinated beverages or chocolate 12 hours prior to your test. Do not take any antihistamines 12 hours prior to your test.  On the Day of the Test: Drink plenty of water until 1 hour prior to the test. Do not eat any food 1 hour prior to test. You may take your regular medications prior to the test.       After the Test: Drink plenty of water. After receiving IV contrast, you may experience a mild flushed feeling. This is normal. On  occasion, you may experience a mild rash up to 24 hours after the test. This is not dangerous. If this  occurs, you can take Benadryl 25 mg and increase your fluid intake. If you experience trouble breathing, this can be serious. If it is severe call 911 IMMEDIATELY. If it is mild, please call our office. If you take any of these medications: Glipizide/Metformin, Avandament, Glucavance, please do not take 48 hours after completing test unless otherwise instructed.  We will call to schedule your test 2-4 weeks out understanding that some insurance companies will need an authorization prior to the service being performed.   For more information and frequently asked questions, please visit our website : [email or web address]  For non-scheduling related questions, please contact the cardiac imaging nurse navigator should you have any questions/concerns: Cardiac Imaging Nurse Navigators Direct Office Dial: [phone]   For scheduling needs, including cancellations and rescheduling, please call [place], [phone].     Signed, Georgie Chard, NP  06/12/2023 12:41 PM    [facility]

## 2023-06-12 NOTE — Patient Instructions (Addendum)
Medication Instructions:  Your physician recommends that you continue on your current medications as directed. Please refer to the Current Medication list given to you today.  *If you need a refill on your cardiac medications before your next appointment, please call your pharmacy*  Lab Work: To be completed within a week - Lipoprotein-A & Apolipoprotein B   If you have labs (blood work) drawn today and your tests are completely normal, you will receive your results only by: MyChart Message (if you have MyChart) OR A paper copy in the mail If you have any lab test that is abnormal or we need to change your treatment, we will call you to review the results.  Testing/Procedures: None ordered today.  Follow-Up: At [facility], you and your health needs are our priority.  As part of our continuing mission to provide you with exceptional heart care, we have created designated Provider Care Teams.  These Care Teams include your primary Cardiologist (physician) and Advanced Practice Providers (APPs -  Physician Assistants and Nurse Practitioners) who all work together to provide you with the care you need, when you need it.   Your next appointment:   1 year(s)  The format for your next appointment:   In Person  Provider:   Verne Carrow, MD {  Other Instructions   Your cardiac CT will be scheduled at one of the below locations:   [facility] [address] [place], [place] [address] [phone]   If scheduled at [facility], please arrive at the [facility] and Children's Entrance (Entrance C2) of [facility] 30 minutes prior to test start time. You can use the FREE valet parking offered at entrance C (encouraged to control the heart rate for the test)  Proceed to the [facility] Radiology Department (first floor) to check-in and test prep.  All radiology patients and guests should use entrance C2 at [facility], accessed from [facility], even though the hospital's physical address listed is [address].    Please follow these instructions carefully (unless otherwise directed):  An IV will be required for this test and Nitroglycerin will be given.  Hold all erectile dysfunction medications at least 3 days (72 hrs) prior to test. (Ie viagra, cialis, sildenafil, tadalafil, etc)   On the Night Before the Test: Be sure to Drink plenty of water. Do not consume any caffeinated/decaffeinated beverages or chocolate 12 hours prior to your test. Do not take any antihistamines 12 hours prior to your test.  On the Day of the Test: Drink plenty of water until 1 hour prior to the test. Do not eat any food 1 hour prior to test. You may take your regular medications prior to the test.       After the Test: Drink plenty of water. After receiving IV contrast, you may experience a mild flushed feeling. This is normal. On occasion, you may experience a mild rash up to 24 hours after the test. This is not dangerous. If this occurs, you can take Benadryl 25 mg and increase your fluid intake. If you experience trouble breathing, this can be serious. If it is severe call 911 IMMEDIATELY. If it is mild, please call our office. If you take any of these medications: Glipizide/Metformin, Avandament, Glucavance, please do not take 48 hours after completing test unless otherwise instructed.  We will call to schedule your test 2-4 weeks out understanding that some insurance companies will need an authorization prior  to the service being performed.   For more information and frequently asked questions, please visit our website : [email or web address]  For non-scheduling related questions, please contact the cardiac imaging nurse navigator should you have any questions/concerns: Cardiac Imaging Nurse Navigators Direct Office Dial: [phone]   For scheduling needs, including cancellations and  rescheduling, please call [place], [phone].

## 2023-06-25 ENCOUNTER — Telehealth: Payer: Self-pay | Admitting: *Deleted

## 2023-06-25 DIAGNOSIS — M9905 Segmental and somatic dysfunction of pelvic region: Secondary | ICD-10-CM | POA: Diagnosis not present

## 2023-06-25 DIAGNOSIS — M9903 Segmental and somatic dysfunction of lumbar region: Secondary | ICD-10-CM | POA: Diagnosis not present

## 2023-06-25 DIAGNOSIS — M9902 Segmental and somatic dysfunction of thoracic region: Secondary | ICD-10-CM | POA: Diagnosis not present

## 2023-06-25 DIAGNOSIS — M9906 Segmental and somatic dysfunction of lower extremity: Secondary | ICD-10-CM | POA: Diagnosis not present

## 2023-06-25 NOTE — Telephone Encounter (Signed)
 Advised by Dr. Clifton Hermenegildo to contact patient   Joshua King! this patient is scheduled for a cardiac CT on Friday and he has 2 stents (LAD+diag) both look to be less than 3mm in size. he doesn't meet criteria for a cardiac CT due to this. we can do cardiac CTs on patients who have a singular stent and greater than 3mm in size. I know in your note you mentioned PET.   CM Kathleene Hazel, MD [name]. It sounds like he is asymptomatic and just wanted to take a look to see if things are ok. I don't do CTAs for people with stents. Noreene Larsson saw him and arranged this. Nguyen Todorov, Do you mind letting him know that this is not a good test for him?  I would cancel this test for now and let him know that I will be happy to see him in the office sometime to discuss if he needs any testing. if he is having angina or dyspnea, we could arrange a PET CT stress. Thanks, chris ___________________________________________________  Joshua King and spoke w patient.  He denies angina.  Denies dyspnea.  Feeling very good.  He would like to discuss with Dr. Clifton Jen in a few weeks if there is something else to do in place of the cCTA.  Appointment scheduled.  Pt appreciative for call.  cCTA appointment cancelled.

## 2023-06-28 ENCOUNTER — Ambulatory Visit (HOSPITAL_COMMUNITY): Admission: RE | Admit: 2023-06-28 | Payer: BC Managed Care – PPO | Source: Ambulatory Visit

## 2023-06-28 ENCOUNTER — Encounter (HOSPITAL_COMMUNITY): Payer: Self-pay

## 2023-06-28 DIAGNOSIS — M9906 Segmental and somatic dysfunction of lower extremity: Secondary | ICD-10-CM | POA: Diagnosis not present

## 2023-06-28 DIAGNOSIS — M9902 Segmental and somatic dysfunction of thoracic region: Secondary | ICD-10-CM | POA: Diagnosis not present

## 2023-06-28 DIAGNOSIS — M9903 Segmental and somatic dysfunction of lumbar region: Secondary | ICD-10-CM | POA: Diagnosis not present

## 2023-06-28 DIAGNOSIS — M9905 Segmental and somatic dysfunction of pelvic region: Secondary | ICD-10-CM | POA: Diagnosis not present

## 2023-07-08 DIAGNOSIS — M9906 Segmental and somatic dysfunction of lower extremity: Secondary | ICD-10-CM | POA: Diagnosis not present

## 2023-07-08 DIAGNOSIS — M9903 Segmental and somatic dysfunction of lumbar region: Secondary | ICD-10-CM | POA: Diagnosis not present

## 2023-07-08 DIAGNOSIS — M9905 Segmental and somatic dysfunction of pelvic region: Secondary | ICD-10-CM | POA: Diagnosis not present

## 2023-07-08 DIAGNOSIS — M9902 Segmental and somatic dysfunction of thoracic region: Secondary | ICD-10-CM | POA: Diagnosis not present

## 2023-07-19 DIAGNOSIS — M9905 Segmental and somatic dysfunction of pelvic region: Secondary | ICD-10-CM | POA: Diagnosis not present

## 2023-07-19 DIAGNOSIS — M9906 Segmental and somatic dysfunction of lower extremity: Secondary | ICD-10-CM | POA: Diagnosis not present

## 2023-07-19 DIAGNOSIS — M9902 Segmental and somatic dysfunction of thoracic region: Secondary | ICD-10-CM | POA: Diagnosis not present

## 2023-07-19 DIAGNOSIS — M9903 Segmental and somatic dysfunction of lumbar region: Secondary | ICD-10-CM | POA: Diagnosis not present

## 2023-07-22 ENCOUNTER — Other Ambulatory Visit: Payer: Self-pay | Admitting: Cardiovascular Disease

## 2023-07-24 DIAGNOSIS — M9902 Segmental and somatic dysfunction of thoracic region: Secondary | ICD-10-CM | POA: Diagnosis not present

## 2023-07-24 DIAGNOSIS — M9903 Segmental and somatic dysfunction of lumbar region: Secondary | ICD-10-CM | POA: Diagnosis not present

## 2023-07-24 DIAGNOSIS — M9905 Segmental and somatic dysfunction of pelvic region: Secondary | ICD-10-CM | POA: Diagnosis not present

## 2023-07-24 DIAGNOSIS — M9906 Segmental and somatic dysfunction of lower extremity: Secondary | ICD-10-CM | POA: Diagnosis not present

## 2023-07-31 ENCOUNTER — Ambulatory Visit: Attending: Cardiovascular Disease | Admitting: Cardiovascular Disease

## 2023-07-31 ENCOUNTER — Other Ambulatory Visit: Payer: Self-pay | Admitting: *Deleted

## 2023-07-31 ENCOUNTER — Encounter: Payer: Self-pay | Admitting: Cardiovascular Disease

## 2023-07-31 VITALS — BP 148/90 | HR 35 | Ht 69.0 in | Wt 219.0 lb

## 2023-07-31 DIAGNOSIS — E7800 Pure hypercholesterolemia, unspecified: Secondary | ICD-10-CM

## 2023-07-31 DIAGNOSIS — I2510 Atherosclerotic heart disease of native coronary artery without angina pectoris: Secondary | ICD-10-CM

## 2023-07-31 DIAGNOSIS — I10 Essential (primary) hypertension: Secondary | ICD-10-CM

## 2023-07-31 DIAGNOSIS — I712 Thoracic aortic aneurysm, without rupture, unspecified: Secondary | ICD-10-CM | POA: Diagnosis not present

## 2023-07-31 MED ORDER — ROSUVASTATIN CALCIUM 20 MG PO TABS: 20.0000 mg | ORAL_TABLET | Freq: Every day | ORAL | 3 refills | Status: AC

## 2023-07-31 NOTE — Progress Notes (Signed)
 Chief Complaint  Patient presents with   Follow-up    CAD   History of Present Illness: 63 yo male with history of CAD, thoracic aortic aneurysm, HTN, HLD, GERD, sleep apnea and PVCs here today for cardiac follow up. He was admitted to [facility] in 2014 with a NSTEMI. Cardiac cath with severe stenosis mid LAD and second Diagonal. Drug eluting stents were placed in the LAD and diagonal branch. He has not tolerated beta blockers due to bradycardia and did not tolerate Lisinopril due to hypotension. Exercise stress test in our office May 2014 without ischemia. In 2016, he went to the ED in [place] with chest pain, was admitted and had a stress test which was reported as normal. Echo 01/07/15 with normal LV function, dilated aortic root. Chest CTA March 2024 with stable dilated ascending aorta at 4.4 cm, unchanged from through serial studies dating back to 2016. 30 day event monitor in October 2016 showed sinus rhythm with rare PVCs.     He is here today for follow up. The patient denies any chest pain, dyspnea, palpitations, lower extremity edema, orthopnea, PND, dizziness, near syncope or syncope.   Primary Care Physician: Patient, No Pcp Per  Past Medical History:  Diagnosis Date   CAD (coronary artery disease), native coronary artery 05/24/2012   a. s/p NSTEMI 2/14: LHC 06/17/12: Mid LAD 90% and 80%, D2 99%, EF 55-60%. PCI: Promus Premier DES to the mid LAD and Promus Premier DES to the D2   Chicken pox    GERD (gastroesophageal reflux disease)    History of echocardiogram    Echo 9/16:  EF 55-60%, normal diastolic fxn, mod dilatation of aortic sinuses, mild LAE   Hyperlipidemia    Hypertension    Sinus bradycardia    Thoracic aortic aneurysm (HCC)     Past Surgical History:  Procedure Laterality Date   APPENDECTOMY  1992   CORONARY ANGIOPLASTY WITH STENT PLACEMENT  05/2012   90% then 80% mid LAD stenoses s/p DES x 1, 99% D2 lesion s/p DES x 1; LVEF 55-60%   HERNIA REPAIR  1999    inguinal   LEFT HEART CATHETERIZATION WITH CORONARY ANGIOGRAM N/A 06/17/2012   Procedure: LEFT HEART CATHETERIZATION WITH CORONARY ANGIOGRAM;  Surgeon: Kathleene Hazel, MD;  Location: [facility] CATH LAB;  Service: Cardiovascular;  Laterality: N/A;   PERCUTANEOUS CORONARY STENT INTERVENTION (PCI-S)  06/17/2012   Procedure: PERCUTANEOUS CORONARY STENT INTERVENTION (PCI-S);  Surgeon: Kathleene Hazel, MD;  Location: [facility] CATH LAB;  Service: Cardiovascular;;    Current Outpatient Medications  Medication Sig Dispense Refill   aspirin EC 81 MG tablet Take 81 mg by mouth daily. Swallow whole.     buPROPion (WELLBUTRIN XL) 150 MG 24 hr tablet Take 1 tablet by mouth daily.     metFORMIN (GLUCOPHAGE) 500 MG tablet Take 500 mg by mouth in the morning and at bedtime.     rosuvastatin (CRESTOR) 20 MG tablet Take 1 tablet (20 mg total) by mouth daily. 90 tablet 3   WEGOVY 0.5 MG/0.5ML SOAJ Inject 0.5 mg into the skin once a week.     Continuous Glucose Sensor (FREESTYLE LIBRE 3 SENSOR) MISC ONE SENSOR EVERY 2 WEEKS (Patient not taking: Reported on 07/31/2023)     nitroGLYCERIN (NITROSTAT) 0.4 MG SL tablet PLACE 1 TAB UNDER THE TONGUE EVERY 5 MINUTES X3 DOSES AS NEEDED FOR CHEST PAIN (Patient not taking: Reported on 07/31/2023) 75 tablet 1   No current facility-administered medications for this visit.  No Known Allergies  Social History   Socioeconomic History   Marital status: Married    Spouse name: Not on file   Number of children: 2   Years of education: Not on file   Highest education level: Not on file  Occupational History   Occupation: Business Manager/sales    Employer: [employer]  Tobacco Use   Smoking status: Never   Smokeless tobacco: Never  Substance and Sexual Activity   Alcohol use: Yes    Alcohol/week: 6.0 - 8.0 standard drinks of alcohol    Types: 6 - 8 Standard drinks or equivalent per week   Drug use: No   Sexual activity: Not on file  Other Topics Concern   Not  on file  Social History Narrative   Not on file   Social Drivers of Health   Financial Resource Strain: Not on file  Food Insecurity: Not on file  Transportation Needs: Not on file  Physical Activity: Not on file  Stress: Not on file  Social Connections: Not on file  Intimate Partner Violence: Not on file    Family History  Problem Relation Age of Onset   CAD Father 29   Heart attack Father    Arthritis Father    Cancer Father        prostate   Heart disease Father    Alcohol abuse Mother    Hypertension Mother     Review of Systems:  As stated in the HPI and otherwise negative.   BP (!) 148/90   Pulse (!) 35   Ht 5\' 9"  (1.753 m)   Wt 99.3 kg   SpO2 99%   BMI 32.34 kg/m   Physical Examination: General: Well developed, well nourished, NAD  HEENT: OP clear, mucus membranes moist  SKIN: warm, dry. No rashes. Neuro: No focal deficits  Musculoskeletal: Muscle strength 5/5 all ext  Psychiatric: Mood and affect normal  Neck: No JVD, no carotid bruits, no thyromegaly, no lymphadenopathy.  Lungs:Clear bilaterally, no wheezes, rhonci, crackles Cardiovascular: Regular rate and rhythm. No murmurs, gallops or rubs. Abdomen:Soft. Bowel sounds present. Non-tender.  Extremities: No lower extremity edema. Pulses are 2 + in the bilateral DP/PT.  EKG:  EKG is not ordered today. The ekg ordered today demonstrates   Recent Labs: No results found for requested labs within last 365 days.   Lipid Panel    Component Value Date/Time   CHOL 163 10/17/2015 0858   CHOL 165 12/07/2013 0859   TRIG 57 10/17/2015 0858   TRIG 70 12/07/2013 0859   HDL 72 10/17/2015 0858   HDL 60 12/07/2013 0859   CHOLHDL 2.3 10/17/2015 0858   VLDL 11 10/17/2015 0858   LDLCALC 80 10/17/2015 0858   LDLCALC 91 12/07/2013 0859     Wt Readings from Last 3 Encounters:  07/31/23 99.3 kg  06/12/23 101.6 kg  06/15/22 99 kg    Assessment and Plan:   1. CAD without angina: No chest pain. He does not  tolerate beta blockers due to bradycardia. Continue ASA and statin.   2. HTN: BP is mildly elevated today but has been well controlled at home. He is not on anti-hypertensive therapy.   3. Hyperlipidemia: LDL 85 in October 2024. Will increase Crestor to 20 mg per day. Lipids and LFTs in 12 weeks.    4. Thoracic aortic aneurysm: Dilation of the ascending aorta at 4.4 cm which is stable through serial scans dating back to 2016. Repeat chest CTA now.  Labs/ tests ordered today include:   Orders Placed This Encounter  Procedures   CT ANGIO CHEST AORTA W/CM & OR WO/CM   Lipid panel   Hepatic function panel   Disposition:   F/U with me in 12 months  Signed, Verne Carrow, MD 07/31/2023 11:28 AM    [facility] [address] [place], [place], [place]  [address] Phone: [phone]; Fax: [phone]

## 2023-07-31 NOTE — Patient Instructions (Signed)
 Medication Instructions:  Your physician has recommended you make the following change in your medication:  1.) increase rosuvastatin to 20 mg - one tablet daily  *If you need a refill on your cardiac medications before your next appointment, please call your pharmacy*  Lab Work: In 12 weeks - lipids and liver function  If you have labs (blood work) drawn today and your tests are completely normal, you will receive your results only by: MyChart Message (if you have MyChart) OR A paper copy in the mail If you have any lab test that is abnormal or we need to change your treatment, we will call you to review the results.  Testing/Procedures: Chest CT Angiogram - hold metformin day of procedure and for 2 whole days after procedure  Follow-Up: At [facility], you and your health needs are our priority.  As part of our continuing mission to provide you with exceptional heart care, our providers are all part of one team.  This team includes your primary Cardiologist (physician) and Advanced Practice Providers or APPs (Physician Assistants and Nurse Practitioners) who all work together to provide you with the care you need, when you need it.  Your next appointment:   12 month(s)  Provider:   Verne Carrow, MD       1st Floor: - Lobby - Registration  - Pharmacy  - Lab - Cafe  2nd Floor: - PV Lab - Diagnostic Testing (echo, CT, nuclear med)  3rd Floor: - Vacant  4th Floor: - TCTS (cardiothoracic surgery) - AFib Clinic - Structural Heart Clinic - Vascular Surgery  - Vascular Ultrasound  5th Floor: - HeartCare Cardiology (general and EP) - Clinical Pharmacy for coumadin, hypertension, lipid, weight-loss medications, and med management appointments    Valet parking services will be available as well.

## 2023-08-07 ENCOUNTER — Ambulatory Visit (HOSPITAL_COMMUNITY)

## 2023-08-07 ENCOUNTER — Encounter (HOSPITAL_COMMUNITY): Payer: Self-pay

## 2023-08-08 ENCOUNTER — Encounter: Payer: Self-pay | Admitting: Cardiovascular Disease

## 2023-08-08 ENCOUNTER — Encounter: Payer: Self-pay | Admitting: Family Medicine

## 2023-08-19 DIAGNOSIS — M9903 Segmental and somatic dysfunction of lumbar region: Secondary | ICD-10-CM | POA: Diagnosis not present

## 2023-08-19 DIAGNOSIS — M9902 Segmental and somatic dysfunction of thoracic region: Secondary | ICD-10-CM | POA: Diagnosis not present

## 2023-08-19 DIAGNOSIS — M9906 Segmental and somatic dysfunction of lower extremity: Secondary | ICD-10-CM | POA: Diagnosis not present

## 2023-08-19 DIAGNOSIS — M9905 Segmental and somatic dysfunction of pelvic region: Secondary | ICD-10-CM | POA: Diagnosis not present

## 2023-08-26 DIAGNOSIS — M9905 Segmental and somatic dysfunction of pelvic region: Secondary | ICD-10-CM | POA: Diagnosis not present

## 2023-08-26 DIAGNOSIS — M9906 Segmental and somatic dysfunction of lower extremity: Secondary | ICD-10-CM | POA: Diagnosis not present

## 2023-08-26 DIAGNOSIS — M9903 Segmental and somatic dysfunction of lumbar region: Secondary | ICD-10-CM | POA: Diagnosis not present

## 2023-08-26 DIAGNOSIS — M9902 Segmental and somatic dysfunction of thoracic region: Secondary | ICD-10-CM | POA: Diagnosis not present

## 2023-08-27 ENCOUNTER — Ambulatory Visit
Admission: RE | Admit: 2023-08-27 | Discharge: 2023-08-27 | Disposition: A | Discharge status: Home / Self Care | Source: Ambulatory Visit | Attending: Cardiovascular Disease | Admitting: Cardiovascular Disease

## 2023-08-27 DIAGNOSIS — I7121 Aneurysm of the ascending aorta, without rupture: Secondary | ICD-10-CM | POA: Diagnosis not present

## 2023-08-27 DIAGNOSIS — I2510 Atherosclerotic heart disease of native coronary artery without angina pectoris: Secondary | ICD-10-CM | POA: Diagnosis not present

## 2023-08-27 DIAGNOSIS — I712 Thoracic aortic aneurysm, without rupture, unspecified: Secondary | ICD-10-CM

## 2023-08-27 MED ORDER — IOPAMIDOL (ISOVUE-370) INJECTION 76%
75.0000 mL | Freq: Once | INTRAVENOUS | Status: AC | PRN
  Administered 2023-08-27: 75 mL via INTRAVENOUS

## 2023-08-29 DIAGNOSIS — M9905 Segmental and somatic dysfunction of pelvic region: Secondary | ICD-10-CM | POA: Diagnosis not present

## 2023-08-29 DIAGNOSIS — M9903 Segmental and somatic dysfunction of lumbar region: Secondary | ICD-10-CM | POA: Diagnosis not present

## 2023-08-29 DIAGNOSIS — M9906 Segmental and somatic dysfunction of lower extremity: Secondary | ICD-10-CM | POA: Diagnosis not present

## 2023-08-29 DIAGNOSIS — M9902 Segmental and somatic dysfunction of thoracic region: Secondary | ICD-10-CM | POA: Diagnosis not present

## 2023-09-04 ENCOUNTER — Ambulatory Visit: Payer: Self-pay | Admitting: *Deleted

## 2023-09-04 DIAGNOSIS — I712 Thoracic aortic aneurysm, without rupture, unspecified: Secondary | ICD-10-CM

## 2023-09-04 NOTE — Telephone Encounter (Signed)
 Reviewed ct aorta results w patient who voices understanding and agreement to see CT surgery.  Referral placed.

## 2023-10-07 DIAGNOSIS — M9906 Segmental and somatic dysfunction of lower extremity: Secondary | ICD-10-CM | POA: Diagnosis not present

## 2023-10-07 DIAGNOSIS — M9902 Segmental and somatic dysfunction of thoracic region: Secondary | ICD-10-CM | POA: Diagnosis not present

## 2023-10-07 DIAGNOSIS — M9903 Segmental and somatic dysfunction of lumbar region: Secondary | ICD-10-CM | POA: Diagnosis not present

## 2023-10-07 DIAGNOSIS — M9905 Segmental and somatic dysfunction of pelvic region: Secondary | ICD-10-CM | POA: Diagnosis not present

## 2023-10-09 DIAGNOSIS — M9902 Segmental and somatic dysfunction of thoracic region: Secondary | ICD-10-CM | POA: Diagnosis not present

## 2023-10-09 DIAGNOSIS — M9903 Segmental and somatic dysfunction of lumbar region: Secondary | ICD-10-CM | POA: Diagnosis not present

## 2023-10-09 DIAGNOSIS — M9906 Segmental and somatic dysfunction of lower extremity: Secondary | ICD-10-CM | POA: Diagnosis not present

## 2023-10-09 DIAGNOSIS — M9905 Segmental and somatic dysfunction of pelvic region: Secondary | ICD-10-CM | POA: Diagnosis not present

## 2023-10-16 NOTE — Patient Instructions (Signed)

## 2023-10-16 NOTE — Progress Notes (Unsigned)
      [address] [place] [address]             [phone]        AMEN STASZAK [id number] 1960/07/16  History of Present Illness: Mr. Mollica is a 63 yo male with history of CAD s/p NSTEMI 2014, thoracic aortic aneurysm, HTN, HLD, GERD, sleep apnea and PVCs. He was incidentally found to have a 4.4cm ascending aortic aneurysm on CT scan in 2016 and has undergone serial chest CT and aneurysm has remained stable. CTA ordered by cardiology on 08/27/23 measured his arotic root at4.7cm and his ascending aorta 4.1cm. He has been referred to our office for further aneurysm surveillance  He denies family history of ATAA and personal history of connective tissue disorder.   Today he reports ***   Current Outpatient Medications on File Prior to Visit  Medication Sig Dispense Refill   aspirin  EC 81 MG tablet Take 81 mg by mouth daily. Swallow whole.     buPROPion  (WELLBUTRIN  XL) 150 MG 24 hr tablet Take 1 tablet by mouth daily.     Continuous Glucose Sensor (FREESTYLE LIBRE 3 SENSOR) MISC ONE SENSOR EVERY 2 WEEKS (Patient not taking: Reported on 07/31/2023)     metFORMIN (GLUCOPHAGE) 500 MG tablet Take 500 mg by mouth in the morning and at bedtime.     nitroGLYCERIN  (NITROSTAT ) 0.4 MG SL tablet PLACE 1 TAB UNDER THE TONGUE EVERY 5 MINUTES X3 DOSES AS NEEDED FOR CHEST PAIN (Patient not taking: Reported on 07/31/2023) 75 tablet 1   rosuvastatin  (CRESTOR ) 20 MG tablet Take 1 tablet (20 mg total) by mouth daily. 90 tablet 3   WEGOVY 0.5 MG/0.5ML SOAJ Inject 0.5 mg into the skin once a week.     No current facility-administered medications on file prior to visit.    Vitals:  ROS  Physical Exam  CTA Results:     Impression and Plan: *** with a *** cm ascending aortic aneurysm.  Echocardiogram shows a *** valve without evidence of regurgitation.  We discussed the natural history and and risk factors for growth of ascending aortic aneurysms.  We covered the importance of  smoking cessation, tight blood pressure control, refraining from lifting heavy objects, and avoiding fluoroquinolones.  The patient is aware of signs and symptoms of aortic dissection and when to present to the emergency department.  We will continue surveillance and a repeat ** was ordered for ***.       Risk Modification:  Statin:  ***  Smoking cessation instruction/counseling given:  {CHL AMB [facility] SMOKING CESSATION COUNSELING:20758}  Patient was counseled on importance of Blood Pressure Control.  Despite Medical intervention if the patient notices persistently elevated blood pressure readings.  They are instructed to contact their Primary Care Physician  Please avoid use of Fluoroquinolones as this can potentially increase your risk of Aortic Rupture and/or Dissection  Patient educated on signs and symptoms of Aortic Dissection, handout also provided in AVS  Con GORMAN Bend, PA-C 10/16/23

## 2023-10-22 ENCOUNTER — Ambulatory Visit: Attending: Surgery | Admitting: Physician Assistant

## 2023-10-22 VITALS — BP 152/89 | HR 58 | Resp 20 | Ht 69.0 in | Wt 210.8 lb

## 2023-10-22 DIAGNOSIS — I712 Thoracic aortic aneurysm, without rupture, unspecified: Secondary | ICD-10-CM | POA: Insufficient documentation

## 2023-10-22 DIAGNOSIS — I7121 Aneurysm of the ascending aorta, without rupture: Secondary | ICD-10-CM | POA: Diagnosis not present

## 2023-11-12 DIAGNOSIS — M9905 Segmental and somatic dysfunction of pelvic region: Secondary | ICD-10-CM | POA: Diagnosis not present

## 2023-11-12 DIAGNOSIS — M9902 Segmental and somatic dysfunction of thoracic region: Secondary | ICD-10-CM | POA: Diagnosis not present

## 2023-11-12 DIAGNOSIS — M9903 Segmental and somatic dysfunction of lumbar region: Secondary | ICD-10-CM | POA: Diagnosis not present

## 2023-11-12 DIAGNOSIS — M7071 Other bursitis of hip, right hip: Secondary | ICD-10-CM | POA: Diagnosis not present

## 2023-11-12 DIAGNOSIS — M9906 Segmental and somatic dysfunction of lower extremity: Secondary | ICD-10-CM | POA: Diagnosis not present

## 2023-11-15 DIAGNOSIS — M9902 Segmental and somatic dysfunction of thoracic region: Secondary | ICD-10-CM | POA: Diagnosis not present

## 2023-11-15 DIAGNOSIS — M9903 Segmental and somatic dysfunction of lumbar region: Secondary | ICD-10-CM | POA: Diagnosis not present

## 2023-11-15 DIAGNOSIS — M9906 Segmental and somatic dysfunction of lower extremity: Secondary | ICD-10-CM | POA: Diagnosis not present

## 2023-11-15 DIAGNOSIS — M9905 Segmental and somatic dysfunction of pelvic region: Secondary | ICD-10-CM | POA: Diagnosis not present

## 2023-11-19 DIAGNOSIS — M9905 Segmental and somatic dysfunction of pelvic region: Secondary | ICD-10-CM | POA: Diagnosis not present

## 2023-11-19 DIAGNOSIS — M9906 Segmental and somatic dysfunction of lower extremity: Secondary | ICD-10-CM | POA: Diagnosis not present

## 2023-11-19 DIAGNOSIS — M9903 Segmental and somatic dysfunction of lumbar region: Secondary | ICD-10-CM | POA: Diagnosis not present

## 2023-11-19 DIAGNOSIS — M9902 Segmental and somatic dysfunction of thoracic region: Secondary | ICD-10-CM | POA: Diagnosis not present

## 2023-12-20 DIAGNOSIS — M9905 Segmental and somatic dysfunction of pelvic region: Secondary | ICD-10-CM | POA: Diagnosis not present

## 2023-12-20 DIAGNOSIS — M9903 Segmental and somatic dysfunction of lumbar region: Secondary | ICD-10-CM | POA: Diagnosis not present

## 2023-12-20 DIAGNOSIS — M9906 Segmental and somatic dysfunction of lower extremity: Secondary | ICD-10-CM | POA: Diagnosis not present

## 2023-12-20 DIAGNOSIS — M9902 Segmental and somatic dysfunction of thoracic region: Secondary | ICD-10-CM | POA: Diagnosis not present

## 2023-12-26 DIAGNOSIS — Z08 Encounter for follow-up examination after completed treatment for malignant neoplasm: Secondary | ICD-10-CM | POA: Diagnosis not present

## 2023-12-26 DIAGNOSIS — L821 Other seborrheic keratosis: Secondary | ICD-10-CM | POA: Diagnosis not present

## 2023-12-26 DIAGNOSIS — L570 Actinic keratosis: Secondary | ICD-10-CM | POA: Diagnosis not present

## 2023-12-26 DIAGNOSIS — D225 Melanocytic nevi of trunk: Secondary | ICD-10-CM | POA: Diagnosis not present

## 2023-12-26 DIAGNOSIS — L814 Other melanin hyperpigmentation: Secondary | ICD-10-CM | POA: Diagnosis not present

## 2024-04-01 ENCOUNTER — Other Ambulatory Visit: Payer: Self-pay | Admitting: Surgery

## 2024-04-01 DIAGNOSIS — I7121 Aneurysm of the ascending aorta, without rupture: Secondary | ICD-10-CM

## 2024-04-02 ENCOUNTER — Ambulatory Visit: Payer: Self-pay | Admitting: Cardiovascular Disease

## 2024-04-02 ENCOUNTER — Ambulatory Visit (HOSPITAL_COMMUNITY)
Admission: RE | Admit: 2024-04-02 | Discharge: 2024-04-02 | Disposition: A | Discharge status: Home / Self Care | Source: Ambulatory Visit | Attending: Internal Medicine | Admitting: Internal Medicine

## 2024-04-02 DIAGNOSIS — I7121 Aneurysm of the ascending aorta, without rupture: Secondary | ICD-10-CM | POA: Diagnosis not present

## 2024-04-02 LAB — ECHOCARDIOGRAM COMPLETE
Area-P 1/2: 3.17 cm2
S' Lateral: 3.2 cm

## 2024-04-24 ENCOUNTER — Ambulatory Visit (HOSPITAL_COMMUNITY)
Admission: RE | Admit: 2024-04-24 | Discharge: 2024-04-24 | Disposition: A | Discharge status: Home / Self Care | Source: Ambulatory Visit | Attending: Surgery | Admitting: Surgery

## 2024-04-24 DIAGNOSIS — I7121 Aneurysm of the ascending aorta, without rupture: Secondary | ICD-10-CM | POA: Diagnosis present

## 2024-04-24 MED ORDER — IOHEXOL 350 MG/ML SOLN
75.0000 mL | Freq: Once | INTRAVENOUS | Status: AC | PRN
  Administered 2024-04-24: 75 mL via INTRAVENOUS

## 2024-04-29 ENCOUNTER — Encounter: Payer: Self-pay | Admitting: Surgery

## 2024-04-29 ENCOUNTER — Ambulatory Visit: Attending: Surgery | Admitting: Surgery

## 2024-04-29 VITALS — BP 171/96 | HR 55 | Resp 18 | Ht 69.0 in | Wt 206.0 lb

## 2024-04-29 DIAGNOSIS — I7121 Aneurysm of the ascending aorta, without rupture: Secondary | ICD-10-CM | POA: Diagnosis not present

## 2024-04-30 NOTE — Progress Notes (Signed)
 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  He was last seen in our office by Con Bend, PA-C on 10/22/2023 and recent CTA of the chest on 08/31/2023 that showed the aortic root to have a diameter of 4.7 cm with the ascending aorta to be 4.1 cm.  Echocardiogram in 2016 had shown a trileaflet aortic valve without regurgitation.  It was felt continued follow-up was warranted.  He reports that he continues to feel well and has continued to have significant weight loss.  He is watching his diet, taking Wegovy, and exercising frequently.  Current Outpatient Medications  Medication Sig Dispense Refill   buPROPion  (WELLBUTRIN  XL) 150 MG 24 hr tablet Take 1 tablet by mouth daily.     metFORMIN (GLUCOPHAGE) 500 MG tablet Take 500 mg by mouth in the morning and at bedtime.     rosuvastatin  (CRESTOR ) 20 MG tablet Take 1 tablet (20 mg total) by mouth daily. 90 tablet 3   WEGOVY 0.5 MG/0.5ML SOAJ Inject 0.5 mg into the skin once a week.     nitroGLYCERIN  (NITROSTAT ) 0.4 MG SL tablet PLACE 1 TAB UNDER THE TONGUE EVERY 5 MINUTES X3 DOSES AS NEEDED FOR CHEST PAIN (Patient not taking: Reported on 04/29/2024) 75 tablet 1   No current facility-administered medications for this visit.     Physical Exam: BP (!) 171/96 (BP Location: Left Arm)   Pulse (!) 55   Resp 18   Ht 5' 9 (1.753 m)   Wt 206 lb (93.4 kg)   SpO2 99% Comment: RA  BMI 30.42 kg/m  He looks well. Cardiac exam shows a regular rate and rhythm with normal heart sounds.  There is no murmur. Lungs are clear.  Diagnostic Tests:  ECHOCARDIOGRAM REPORT       Patient Name:   EQUAN COGBILL Date of Exam: 04/02/2024  Medical Rec #:   [id number]     Height:       69.0 in  Accession #:    [id number]    Weight:       210.8 lb  Date of Birth:  02/14/1961     BSA:          2.113 m  Patient Age:    63 years      BP:           148/90 mmHg  Patient Gender: M             HR:           76 bpm.  Exam Location:  [address]   Procedure: 2D Echo, Cardiac Doppler, Color Doppler, 3D Echo and Strain  Analysis            (Both Spectral and Color Flow Doppler were utilized during             procedure).   Indications:    I71 Ascending aortic aneurysm    History:        Patient has prior history of Echocardiogram examinations,  most  recent 01/07/2015. Previous Myocardial Infarction and CAD,                  Ascending aortic aneurysm, Arrythmias:Bradycardia; Risk                  Factors:Hypertension, Dyslipidemia and Sleep Apnea.    Sonographer:    Elsie Bohr RDCS  Referring Phys: [id number] Brittish Bolinger K Dmya Long   IMPRESSIONS     1. Left ventricular ejection fraction, by estimation, is 60 to 65%. Left  ventricular ejection fraction by 3D volume is 60 %. The left ventricle has  normal function. The left ventricle has no regional wall motion  abnormalities. Left ventricular diastolic   parameters were normal. The average left ventricular global longitudinal  strain is -19.7 %. The global longitudinal strain is normal.   2. Right ventricular systolic function is normal. The right ventricular  size is moderately enlarged.   3. The mitral valve is normal in structure. No evidence of mitral valve  regurgitation. No evidence of mitral stenosis.   4. The aortic valve is normal in structure. Aortic valve regurgitation is  not visualized. No aortic stenosis is present.   5. Aortic dilatation noted. There is severe dilatation of the aortic  root, measuring 5.0 mm. There is moderate dilatation of the ascending  aorta, measuring 4.7 mm.   6. The inferior vena cava is normal in size with greater than 50%  respiratory  variability, suggesting right atrial pressure of 3 mmHg.   Comparison(s): No prior Echocardiogram. Dilated aortic root and ascending  aorta, as well as moderately dilated RV.   FINDINGS   Left Ventricle: Left ventricular ejection fraction, by estimation, is 60  to 65%. Left ventricular ejection fraction by 3D volume is 60 %. The left  ventricle has normal function. The left ventricle has no regional wall  motion abnormalities. The average  left ventricular global longitudinal strain is -19.7 %. Strain was  performed and the global longitudinal strain is normal. The left  ventricular internal cavity size was normal in size. There is no left  ventricular hypertrophy. Left ventricular diastolic  parameters were normal.   Right Ventricle: The right ventricular size is moderately enlarged. No  increase in right ventricular wall thickness. Right ventricular systolic  function is normal.   Left Atrium: Left atrial size was normal in size.   Right Atrium: Right atrial size was normal in size.   Pericardium: There is no evidence of pericardial effusion.   Mitral Valve: The mitral valve is normal in structure. No evidence of  mitral valve regurgitation. No evidence of mitral valve stenosis.   Tricuspid Valve: The tricuspid valve is normal in structure. Tricuspid  valve regurgitation is not demonstrated. No evidence of tricuspid  stenosis.   Aortic Valve: The aortic valve is normal in structure. Aortic valve  regurgitation is not visualized. No aortic stenosis is present.   Pulmonic Valve: The pulmonic valve was normal in structure. Pulmonic valve  regurgitation is mild. No evidence of pulmonic stenosis.   Aorta: Aortic dilatation noted. There is severe dilatation of the aortic  root, measuring 5.0 mm. There is moderate dilatation of the ascending  aorta, measuring 4.7 mm.   Venous: The inferior vena cava is normal in size with greater than 50%  respiratory variability, suggesting  right atrial pressure of 3 mmHg.   IAS/Shunts: No atrial level shunt detected by color flow Doppler.   Additional Comments: 3D was performed not requiring image post processing  on  an independent workstation and was normal.     LEFT VENTRICLE  PLAX 2D  LVIDd:         5.20 cm         Diastology  LVIDs:         3.20 cm         LV e' medial:    8.05 cm/s  LV PW:         1.10 cm         LV E/e' medial:  10.8  LV IVS:        1.30 cm         LV e' lateral:   12.30 cm/s  LVOT diam:     2.30 cm         LV E/e' lateral: 7.1  LV SV:         89  LV SV Index:   42              2D Longitudinal  LVOT Area:     4.15 cm        Strain  LV IVRT:       92 msec         2D Strain GLS   -17.6 %                                 (A4C):                                 2D Strain GLS   -20.5 %                                 (A3C):                                 2D Strain GLS   -20.8 %                                 (A2C):                                 2D Strain GLS   -19.7 %                                 Avg:                                   3D Volume EF                                 LV 3D EF:    Left                                              ventricul  ar                                              ejection                                              fraction                                              by 3D                                              volume is                                              60 %.                                   3D Volume EF:                                 3D EF:        60 %                                 LV EDV:       117 ml                                 LV ESV:       47 ml                                 LV SV:        71 ml   RIGHT VENTRICLE             IVC  RV S prime:     22.20 cm/s  IVC diam: 1.10 cm  TAPSE (M-mode): 3.5 cm                              PULMONARY VEINS                               Diastolic Velocity: 61.20 cm/s                              S/D Velocity:       1.30  Systolic Velocity:  79.60 cm/s   LEFT ATRIUM           Index        RIGHT ATRIUM           Index  LA diam:      3.70 cm 1.75 cm/m   RA Pressure: 3.00 mmHg  LA Vol (A2C): 32.8 ml 15.53 ml/m  RA Area:     19.30 cm  LA Vol (A4C): 59.1 ml 27.97 ml/m  RA Volume:   53.20 ml  25.18 ml/m   AORTIC VALVE  LVOT Vmax:   109.00 cm/s  LVOT Vmean:  73.800 cm/s  LVOT VTI:    0.215 m    AORTA  Ao Root diam: 5.00 cm  Ao Asc diam:  4.70 cm   MITRAL VALVE               TRICUSPID VALVE  MV Area (PHT): 3.17 cm    Estimated RAP:  3.00 mmHg  MV Decel Time: 239 msec  MV E velocity: 87.00 cm/s  SHUNTS  MV A velocity: 75.50 cm/s  Systemic VTI:  0.22 m  MV E/A ratio:  1.15        Systemic Diam: 2.30 cm   Joelle Azobou Tonleu  Electronically signed by Joelle Cedars Tonleu  Signature Date/Time: 04/02/2024/2:50:37 PM        Final      Narrative & Impression  CLINICAL DATA:  Ascending thoracic aortic aneurysm   EXAM: CT ANGIOGRAPHY CHEST WITH CONTRAST   TECHNIQUE: Multidetector CT imaging of the chest was performed using the standard protocol during bolus administration of intravenous contrast. Multiplanar CT image reconstructions and MIPs were obtained to evaluate the vascular anatomy.   RADIATION DOSE REDUCTION: This exam was performed according to the departmental dose-optimization program which includes automated exposure control, adjustment of the mA and/or kV according to patient size and/or use of iterative reconstruction technique.   CONTRAST:  75mL OMNIPAQUE  IOHEXOL  350 MG/ML SOLN   COMPARISON:  Prior CTA of the chest 08/27/2023; 02/02/2025   FINDINGS: Cardiovascular: Two vessel arch anatomy, the right brachiocephalic and left common carotid artery share a common origin. Stable ectasia of the aortic root measuring up to 4.7 cm in maximal diameter. No effacement  of the sino-tubular junction. Minimal aneurysmal dilation of the ascending thoracic aorta remains stable at 4.2 cm. Mild scattered atherosclerotic plaque. No evidence of dissection. The heart remains normal in size. No pericardial effusion. Calcifications present along the coronary arteries.   Mediastinum/Nodes: No enlarged mediastinal, hilar, or axillary lymph nodes. Thyroid gland, trachea, and esophagus demonstrate no significant findings.   Lungs/Pleura: Lungs are clear. No pleural effusion or pneumothorax. Stable subpleural pulmonary nodule within the minor fissure, benign. No imaging follow-up recommended.   Upper Abdomen: No acute abnormality.   Musculoskeletal: No chest wall abnormality. No acute or significant osseous findings.   Review of the MIP images confirms the above findings.   IMPRESSION: 1. Stable ectasia of the aortic root at 4.7 cm. 2. Stable mild fusiform aneurysmal dilation of the ascending thoracic aorta with a maximal diameter at 4.2 cm. 3. Scattered aortic and coronary artery atherosclerotic vascular calcifications. 4. No acute cardiopulmonary process.   Aortic Atherosclerosis (ICD10-I70.0).     Electronically Signed   By: Wilkie Lent M.D.   On: 04/24/2024 11:06    Impression:  This 64 year old gentleman has a trileaflet aortic valve with an aortic root and ascending aortic aneurysm.  This was measured on CTA at  4.7 cm in the aortic root and 4.2 cm in the mid ascending aorta.  The measurements were slightly larger on echo with the aortic root measuring 5 cm and the ascending aorta at 4.7 cm.  I reviewed both studies and I think the echo measurements are probably exaggerated. His aneurysm is still well below the surgical threshold of 5.5 cm.  I reviewed the echo and CT images with him and answered all of his questions.  I stressed the importance of continued good blood pressure control in preventing further enlargement and acute aortic dissection.   I advised him against doing any heavy lifting that may require a Valsalva maneuver and could suddenly raise his blood pressure to high levels.   Plan:  I will plan to see him back in 1 year with a CTA of the chest for aortic surveillance.  I spent 15 minutes performing this established patient evaluation and > 50% of this time was spent face to face counseling and coordinating the care of this patient's aortic aneurysm.    Dorise MARLA Fellers, MD [facility] [phone]       "

## 2024-05-29 ENCOUNTER — Encounter: Payer: Self-pay | Admitting: Cardiovascular Disease
